# Patient Record
Sex: Female | Born: 1955 | Race: Black or African American | Hispanic: No | Marital: Single | Smoking: Former smoker
Health system: Southern US, Community
[De-identification: ages and names within clinical notes are randomized; demographics above are authoritative.]

## PROBLEM LIST (undated history)

## (undated) DIAGNOSIS — R7303 Prediabetes: Secondary | ICD-10-CM

## (undated) DIAGNOSIS — Z9889 Other specified postprocedural states: Secondary | ICD-10-CM

## (undated) DIAGNOSIS — E785 Hyperlipidemia, unspecified: Secondary | ICD-10-CM

## (undated) DIAGNOSIS — M1990 Unspecified osteoarthritis, unspecified site: Secondary | ICD-10-CM

## (undated) DIAGNOSIS — N8110 Cystocele, unspecified: Secondary | ICD-10-CM

## (undated) DIAGNOSIS — Z8739 Personal history of other diseases of the musculoskeletal system and connective tissue: Secondary | ICD-10-CM

## (undated) DIAGNOSIS — F419 Anxiety disorder, unspecified: Secondary | ICD-10-CM

## (undated) DIAGNOSIS — B029 Zoster without complications: Secondary | ICD-10-CM

## (undated) DIAGNOSIS — Z9289 Personal history of other medical treatment: Secondary | ICD-10-CM

## (undated) DIAGNOSIS — R112 Nausea with vomiting, unspecified: Secondary | ICD-10-CM

## (undated) DIAGNOSIS — T8859XA Other complications of anesthesia, initial encounter: Secondary | ICD-10-CM

## (undated) DIAGNOSIS — Z87448 Personal history of other diseases of urinary system: Secondary | ICD-10-CM

## (undated) DIAGNOSIS — T4145XA Adverse effect of unspecified anesthetic, initial encounter: Secondary | ICD-10-CM

## (undated) HISTORY — DX: Personal history of other diseases of the musculoskeletal system and connective tissue: Z87.39

## (undated) HISTORY — DX: Hyperlipidemia, unspecified: E78.5

## (undated) HISTORY — PX: OTHER SURGICAL HISTORY: SHX169

## (undated) HISTORY — DX: Cystocele, unspecified: N81.10

## (undated) HISTORY — PX: JOINT REPLACEMENT: SHX530

## (undated) HISTORY — DX: Zoster without complications: B02.9

## (undated) HISTORY — PX: ABDOMINAL HYSTERECTOMY: SHX81

## (undated) HISTORY — DX: Personal history of other medical treatment: Z92.89

## (undated) HISTORY — DX: Personal history of other diseases of urinary system: Z87.448

## (undated) HISTORY — PX: COLONOSCOPY: SHX174

---

## 2001-03-27 ENCOUNTER — Encounter: Payer: Self-pay | Admitting: Family Medicine

## 2001-03-27 ENCOUNTER — Ambulatory Visit (HOSPITAL_COMMUNITY): Admission: RE | Admit: 2001-03-27 | Discharge: 2001-03-27 | Payer: Self-pay | Admitting: Family Medicine

## 2001-05-27 DIAGNOSIS — Z9289 Personal history of other medical treatment: Secondary | ICD-10-CM

## 2001-05-27 HISTORY — DX: Personal history of other medical treatment: Z92.89

## 2001-08-19 ENCOUNTER — Ambulatory Visit (HOSPITAL_COMMUNITY): Admission: RE | Admit: 2001-08-19 | Discharge: 2001-08-19 | Payer: Self-pay | Admitting: Family Medicine

## 2001-08-19 ENCOUNTER — Encounter: Payer: Self-pay | Admitting: Family Medicine

## 2001-09-16 ENCOUNTER — Other Ambulatory Visit: Admission: RE | Admit: 2001-09-16 | Discharge: 2001-09-16 | Payer: Self-pay | Admitting: Obstetrics and Gynecology

## 2002-05-17 ENCOUNTER — Ambulatory Visit (HOSPITAL_COMMUNITY): Admission: RE | Admit: 2002-05-17 | Discharge: 2002-05-17 | Payer: Self-pay | Admitting: *Deleted

## 2002-05-17 ENCOUNTER — Encounter (INDEPENDENT_AMBULATORY_CARE_PROVIDER_SITE_OTHER): Payer: Self-pay | Admitting: *Deleted

## 2002-07-16 ENCOUNTER — Encounter: Admission: RE | Admit: 2002-07-16 | Discharge: 2002-07-16 | Payer: Self-pay | Admitting: Internal Medicine

## 2002-07-16 ENCOUNTER — Encounter: Payer: Self-pay | Admitting: Internal Medicine

## 2002-12-09 ENCOUNTER — Other Ambulatory Visit: Admission: RE | Admit: 2002-12-09 | Discharge: 2002-12-09 | Payer: Self-pay | Admitting: Obstetrics and Gynecology

## 2004-07-11 ENCOUNTER — Other Ambulatory Visit: Admission: RE | Admit: 2004-07-11 | Discharge: 2004-07-11 | Payer: Self-pay | Admitting: Obstetrics and Gynecology

## 2005-07-04 ENCOUNTER — Other Ambulatory Visit: Admission: RE | Admit: 2005-07-04 | Discharge: 2005-07-04 | Payer: Self-pay | Admitting: Obstetrics and Gynecology

## 2005-09-13 ENCOUNTER — Encounter: Admission: RE | Admit: 2005-09-13 | Discharge: 2005-09-13 | Payer: Self-pay | Admitting: Internal Medicine

## 2007-06-11 ENCOUNTER — Encounter: Admission: RE | Admit: 2007-06-11 | Discharge: 2007-06-11 | Payer: Self-pay | Admitting: Internal Medicine

## 2008-07-06 ENCOUNTER — Ambulatory Visit: Payer: Self-pay | Admitting: Vascular Surgery

## 2009-05-27 DIAGNOSIS — Z9289 Personal history of other medical treatment: Secondary | ICD-10-CM

## 2009-05-27 HISTORY — DX: Personal history of other medical treatment: Z92.89

## 2010-07-23 ENCOUNTER — Other Ambulatory Visit: Payer: Self-pay | Admitting: Internal Medicine

## 2010-07-23 DIAGNOSIS — Z1231 Encounter for screening mammogram for malignant neoplasm of breast: Secondary | ICD-10-CM

## 2010-08-02 ENCOUNTER — Ambulatory Visit
Admission: RE | Admit: 2010-08-02 | Discharge: 2010-08-02 | Disposition: A | Discharge status: Home / Self Care | Payer: BC Managed Care – PPO | Source: Ambulatory Visit | Attending: Internal Medicine | Admitting: Internal Medicine

## 2010-08-02 DIAGNOSIS — Z1231 Encounter for screening mammogram for malignant neoplasm of breast: Secondary | ICD-10-CM

## 2011-05-08 ENCOUNTER — Other Ambulatory Visit: Payer: Self-pay | Admitting: Sports Medicine

## 2011-05-08 DIAGNOSIS — M25552 Pain in left hip: Secondary | ICD-10-CM

## 2011-05-09 ENCOUNTER — Other Ambulatory Visit: Payer: BC Managed Care – PPO

## 2011-09-23 ENCOUNTER — Ambulatory Visit (INDEPENDENT_AMBULATORY_CARE_PROVIDER_SITE_OTHER): Payer: BC Managed Care – PPO | Admitting: Obstetrics and Gynecology

## 2011-09-23 ENCOUNTER — Encounter: Payer: Self-pay | Admitting: Obstetrics and Gynecology

## 2011-09-23 VITALS — BP 100/62 | HR 70 | Ht 68.0 in | Wt 159.0 lb

## 2011-09-23 DIAGNOSIS — B3731 Acute candidiasis of vulva and vagina: Secondary | ICD-10-CM

## 2011-09-23 DIAGNOSIS — B373 Candidiasis of vulva and vagina: Secondary | ICD-10-CM

## 2011-09-23 DIAGNOSIS — N898 Other specified noninflammatory disorders of vagina: Secondary | ICD-10-CM

## 2011-09-23 DIAGNOSIS — R32 Unspecified urinary incontinence: Secondary | ICD-10-CM

## 2011-09-23 DIAGNOSIS — R350 Frequency of micturition: Secondary | ICD-10-CM

## 2011-09-23 HISTORY — DX: Candidiasis of vulva and vagina: B37.3

## 2011-09-23 HISTORY — DX: Frequency of micturition: R35.0

## 2011-09-23 HISTORY — DX: Unspecified urinary incontinence: R32

## 2011-09-23 HISTORY — DX: Other specified noninflammatory disorders of vagina: N89.8

## 2011-09-23 HISTORY — DX: Acute candidiasis of vulva and vagina: B37.31

## 2011-09-23 LAB — POCT WET PREP (WET MOUNT)

## 2011-09-23 LAB — POCT URINALYSIS DIPSTICK
Glucose, UA: NEGATIVE
Ketones, UA: NEGATIVE
Spec Grav, UA: 1.02

## 2011-09-23 MED ORDER — ESTRADIOL 0.1 MG/GM VA CREA: 1.0000 g | TOPICAL_CREAM | Freq: Every day | VAGINAL | Status: DC

## 2011-09-23 MED ORDER — TERCONAZOLE 0.4 % VA CREA: 1.0000 | TOPICAL_CREAM | Freq: Every day | VAGINAL | Status: AC

## 2011-09-23 NOTE — Patient Instructions (Signed)
Atrophic Vaginitis  Atrophic vaginitis is a problem of low levels of estrogen in women. This problem can happen at any age. It is most common in women who have gone through menopause ("the change").    HOW WILL I KNOW IF I HAVE THIS PROBLEM?  You may have:   Trouble with peeing (urinating), such as:   Going to the bathroom often.   A hard time holding your pee until you reach a bathroom.   Leaking pee.   Having pain when you pee.   Itching or a burning feeling.   Vaginal bleeding and spotting.   Pain during sex.   Dryness of the vagina.   A yellow, bad-smelling fluid (discharge) coming from the vagina.  HOW WILL MY DOCTOR CHECK FOR THIS PROBLEM?   During your exam, your doctor will likely find the problem.   If there is a vaginal fluid, it may be checked for infection.  HOW WILL THIS PROBLEM BE TREATED?  Keep the vulvar skin as clean as possible. Moisturizers and lubricants can help with some of the symptoms.  Estrogen replacement can help. There are 2 ways to take estrogen:   Systemic estrogen gets estrogen to your whole body. It takes many weeks or months before the symptoms get better.   You take an estrogen pill.   You use a skin patch. This is a patch that you put on your skin.   If you still have your uterus, your doctor may ask you to take a hormone. Talk to your doctor about the right medicine for you.   Estrogen cream.  This puts estrogen only at the part of your body where you apply it. The cream is put into the vagina or put on the vulvar skin. For some women, estrogen cream works faster than pills or the patch.  CAN ALL WOMEN WITH THIS PROBLEM USE ESTROGEN?  No. Women with certain types of cancer, liver problems, or problems with blood clots should not take estrogen. Your doctor can help you decide the best treatment for your symptoms.  Document Released: 10/30/2007 Document Revised: 05/02/2011 Document Reviewed: 10/30/2007  [employer] Patient Information 2012 [employer].

## 2011-09-23 NOTE — Progress Notes (Signed)
Last Pap: 09/01/2007 WNL: Yes Regular Periods:no-post menopausal   Monthly Breast exam:no Tetanus<58yrs:yes Nl.Bladder Function:yes Daily BMs:yes Healthy Diet:yes Calcium:yes Mammogram:yes 08/02/2010 Exercise:yes Seatbelt: yes Abuse at home: no Stressful work:no Sigmoid-colonoscopy: yes, pt unsure of date Katelyn Copeland  Subjective:    Katelyn Copeland is a 56 y.o. female No obstetric history on file. who presents for annual exam.  The patient c/o deep dysparuenia and post coital urinary incontinence on one occasion.  The following portions of the patient's history were reviewed and updated as appropriate: allergies, current medications, past family history, past medical history, past social history, past surgical history and problem list.  Review of Systems Pertinent items are noted in HPI. Gastrointestinal:No change in bowel habits, no abdominal pain, no rectal bleeding Genitourinary:negative for dysuria, frequency, hematuria, nocturia and urinary incontinence    Objective:     BP 100/62  Pulse 70  Ht 5\' 8"  (1.727 m)  Wt 159 lb (72.122 kg)  BMI 24.18 kg/m2  Weight:  Wt Readings from Last 1 Encounters:  09/23/11 159 lb (72.122 kg)     BMI: Body mass index is 24.18 kg/(m^2). General Appearance: Alert, appropriate appearance for age. No acute distress HEENT: Grossly normal Neck / Thyroid: Supple, no masses, nodes or enlargement Lungs: clear to auscultation bilaterally Back: No CVA tenderness Breast Exam: No masses or nodes.No dimpling, nipple retraction or discharge. Cardiovascular: Regular rate and rhythm. S1, S2, no murmur Gastrointestinal: Soft, non-tender, no masses or organomegaly Pelvic Exam: External genitalia: normal general appearance Vaginal: cystocele present, within 5 cm o introitus Cervix: absent Adnexa: non palpable Uterus: absent Posterior vaginal excoriation with bleeding Rectovaginal: normal rectal, no masses Lymphatic Exam: Non-palpable nodes in  neck, clavicular, axillary, or inguinal regions Skin: no rash or abnormalities Neurologic: Normal gait and speech, no tremor  Psychiatric: Alert and oriented, appropriate affect.    Urinalysis:2+ lekocytes Wet Prep:  Monilia      Assessment:    pelvic relaxation  R/O UTI  Monilia vaginitis Atrophic vaginitis  Plan:    All questions answered. KOH prep.  Terazol Estradiol vaginal cream Follow-up:  in 6 week(s)

## 2011-11-04 ENCOUNTER — Encounter: Payer: BC Managed Care – PPO | Admitting: Obstetrics and Gynecology

## 2011-12-05 ENCOUNTER — Other Ambulatory Visit: Payer: Self-pay | Admitting: Orthopedic Surgery

## 2011-12-05 DIAGNOSIS — M25552 Pain in left hip: Secondary | ICD-10-CM

## 2011-12-05 DIAGNOSIS — M2390 Unspecified internal derangement of unspecified knee: Secondary | ICD-10-CM

## 2011-12-09 ENCOUNTER — Ambulatory Visit
Admission: RE | Admit: 2011-12-09 | Discharge: 2011-12-09 | Disposition: A | Discharge status: Home / Self Care | Payer: BC Managed Care – PPO | Source: Ambulatory Visit | Attending: Orthopedic Surgery | Admitting: Orthopedic Surgery

## 2011-12-09 DIAGNOSIS — M2390 Unspecified internal derangement of unspecified knee: Secondary | ICD-10-CM

## 2011-12-09 DIAGNOSIS — M25552 Pain in left hip: Secondary | ICD-10-CM

## 2011-12-17 ENCOUNTER — Other Ambulatory Visit: Payer: Self-pay | Admitting: Obstetrics and Gynecology

## 2011-12-18 NOTE — Telephone Encounter (Signed)
vph pt 

## 2012-01-07 ENCOUNTER — Telehealth: Payer: Self-pay | Admitting: Obstetrics and Gynecology

## 2012-01-08 ENCOUNTER — Encounter: Payer: Self-pay | Admitting: Obstetrics and Gynecology

## 2012-01-08 ENCOUNTER — Ambulatory Visit (INDEPENDENT_AMBULATORY_CARE_PROVIDER_SITE_OTHER): Payer: BC Managed Care – PPO | Admitting: Obstetrics and Gynecology

## 2012-01-08 VITALS — BP 102/60 | Temp 98.1°F | Ht 68.0 in | Wt 154.0 lb

## 2012-01-08 DIAGNOSIS — Z9189 Other specified personal risk factors, not elsewhere classified: Secondary | ICD-10-CM

## 2012-01-08 DIAGNOSIS — A599 Trichomoniasis, unspecified: Secondary | ICD-10-CM

## 2012-01-08 DIAGNOSIS — Z202 Contact with and (suspected) exposure to infections with a predominantly sexual mode of transmission: Secondary | ICD-10-CM

## 2012-01-08 DIAGNOSIS — N898 Other specified noninflammatory disorders of vagina: Secondary | ICD-10-CM

## 2012-01-08 LAB — POCT WET PREP (WET MOUNT)
Clue Cells Wet Prep Whiff POC: NEGATIVE
KOH Wet Prep POC: NEGATIVE

## 2012-01-08 MED ORDER — METRONIDAZOLE 500 MG PO TABS: ORAL_TABLET | ORAL | Status: DC

## 2012-01-08 NOTE — Progress Notes (Signed)
Problem visit  Vaginal discharge:Color: yellow Odor: yes Itching:no Thin:no Thick:yes Fever:no Dyspareunia:no Hx PID:no HX STD:no Pelvic Pain:no Desires Gc/CT:yes Desires HIV,RPR,HbsAG:yes  Subjective: The patient gives a history of a new sexual partner that she later found out was not monogamous. She complains of new onset of vaginal discharge with odor. She wants testing for STDs.  Objective: BP 102/60  Temp 98.1 F (36.7 C) (Oral)  Ht 5\' 8"  (1.727 m)  Wt 154 lb (69.854 kg)  BMI 23.42 kg/m2  Pelvic: EGBUS within normal limits   Vagina rugous with a well healed vault. There is grayish discharge in the vault   Uterus and cervix are surgically absent   Bimanual no masses Wet prep: Trichomonas  Assessment: Trichomonas Rule out other STDs  Recommendation: Flagyl 2 g p.o. Stat Diflucan 150 mg p.o. After completion of Flagyl Serology for HIV RPR herpes type I and 2 and hepatitis B and C. GC and Chlamydia on urine Patient will be notified of the results Followup at annual examination

## 2012-01-09 LAB — GC/CHLAMYDIA PROBE AMP, URINE: Chlamydia, Swab/Urine, PCR: NEGATIVE

## 2012-01-14 ENCOUNTER — Telehealth: Payer: Self-pay

## 2012-01-14 ENCOUNTER — Other Ambulatory Visit: Payer: Self-pay

## 2012-01-14 MED ORDER — FLUCONAZOLE 150 MG PO TABS: 150.0000 mg | ORAL_TABLET | Freq: Once | ORAL | Status: AC

## 2012-01-14 NOTE — Telephone Encounter (Signed)
Tc to pt per test results. 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 All other std testings=negative. Pt voices understanding and copy of labs mailed to pt per pt's request. Pt states,"vph was suppose to call in Diflucan to take after completing ATB's". Pt with h/o yeast infections s/p ATB usage. Pt c/o vaginal itching. Will consult with vph per recs and cb. Pt agrees.

## 2012-03-31 ENCOUNTER — Encounter: Payer: Self-pay | Admitting: Obstetrics and Gynecology

## 2012-03-31 ENCOUNTER — Ambulatory Visit (INDEPENDENT_AMBULATORY_CARE_PROVIDER_SITE_OTHER): Payer: BC Managed Care – PPO | Admitting: Obstetrics and Gynecology

## 2012-03-31 VITALS — BP 92/60 | Temp 98.8°F | Wt 156.0 lb

## 2012-03-31 DIAGNOSIS — B373 Candidiasis of vulva and vagina: Secondary | ICD-10-CM

## 2012-03-31 DIAGNOSIS — N898 Other specified noninflammatory disorders of vagina: Secondary | ICD-10-CM

## 2012-03-31 DIAGNOSIS — Z113 Encounter for screening for infections with a predominantly sexual mode of transmission: Secondary | ICD-10-CM

## 2012-03-31 DIAGNOSIS — R350 Frequency of micturition: Secondary | ICD-10-CM

## 2012-03-31 DIAGNOSIS — N390 Urinary tract infection, site not specified: Secondary | ICD-10-CM

## 2012-03-31 LAB — POCT URINALYSIS DIPSTICK
Ketones, UA: NEGATIVE
Protein, UA: NEGATIVE
Spec Grav, UA: 1.015
Urobilinogen, UA: NEGATIVE
pH, UA: 5

## 2012-03-31 MED ORDER — CIPROFLOXACIN HCL 500 MG PO TABS: 500.0000 mg | ORAL_TABLET | Freq: Two times a day (BID) | ORAL | Status: AC

## 2012-03-31 MED ORDER — TERCONAZOLE 0.4 % VA CREA: 1.0000 | TOPICAL_CREAM | Freq: Every day | VAGINAL | Status: DC

## 2012-03-31 NOTE — Progress Notes (Signed)
56 YO complains of an itchy vaginal discharge for several weeks that dries itchy and crusty on panties.  Denies urinary tract symptoms but would like for urine to be checked.  O: Pelvic: EGBUS-wnl, vagina-scant white discharge, uterus/cervix-surgically absent  Wet Prep:  pH-4.5,  whiff-negative, many-yeast OSOM Trich-negative U/A- pH-5.0,  SG-1.015, 1+ leuk,  nitrite-positive, 2+-blood otherwise negative  A: UTI     Yeast Vaginitis   P: Urine for culture       Terazol 7 Vaginal Cream #1 tube 1 applicator pv qhs x 7 days no refills      Cipro 500 mg # 14 bid x 7 days no refills      RTO-as scheduled  Keighan Amezcua, PA-C

## 2012-03-31 NOTE — Progress Notes (Signed)
Vag. Discharge:yes Odor:no Fever:no Irreg.Periods:no Dyspareunia:no Dysuria:no Frequency:yes Urgency:no Hematuria:no Kidney stones:no Constipation:no Diarrhea:no Rectal Bleeding: no Vomiting:no Nausea:no Pregnant:no Fibroids:no Endometriosis:no Hx of Ovarian Cyst:no Hx IUD:no Hx STD-PID:yes trich Appendectomy:no Gall Bladder Dz:no

## 2012-03-31 NOTE — Patient Instructions (Signed)
Urinary Tract Infection A urinary tract infection (UTI) is often caused by a germ (bacteria). A UTI is usually helped with medicine (antibiotics) that kills germs. Take all the medicine until it is gone. Do this even if you are feeling better. You are usually better in 7 to 10 days. HOME CARE   Drink enough water and fluids to keep your pee (urine) clear or pale yellow. Drink:  Cranberry juice.  Water.  Avoid:  Caffeine.  Tea.  Bubbly (carbonated) drinks.  Alcohol.  Only take medicine as told by your doctor.  To prevent further infections:  Pee often.  After pooping (bowel movement), women should wipe from front to back. Use each tissue only once.  Pee before and after having sex (intercourse). Ask your doctor when your test results will be ready. Make sure you follow up and get your test results.  GET HELP RIGHT AWAY IF:   There is very bad back pain or lower belly (abdominal) pain.  You get the chills.  You have a fever.  Your baby is older than 3 months with a rectal temperature of 102 F (38.9 C) or higher.  Your baby is 82 months old or younger with a rectal temperature of 100.4 F (38 C) or higher.  You feel sick to your stomach (nauseous) or throw up (vomit).  There is continued burning with peeing.  Your problems are not better in 3 days. Return sooner if you are getting worse. MAKE SURE YOU:   Understand these instructions.  Will watch your condition.  Will get help right away if you are not doing well or get worse. Document Released: 10/30/2007 Document Revised: 08/05/2011 Document Reviewed: 10/30/2007 [facility] Patient Information 2013 [place], [place].  Monilial Vaginitis Vaginitis in a soreness, swelling and redness (inflammation) of the vagina and vulva. Monilial vaginitis is not a sexually transmitted infection. CAUSES  Yeast vaginitis is caused by yeast (candida) that is normally found in your vagina. With a yeast infection, the candida has  overgrown in number to a point that upsets the chemical balance. SYMPTOMS   White, thick vaginal discharge.  Swelling, itching, redness and irritation of the vagina and possibly the lips of the vagina (vulva).  Burning or painful urination.  Painful intercourse. DIAGNOSIS  Things that may contribute to monilial vaginitis are:  Postmenopausal and virginal states.  Pregnancy.  Infections.  Being tired, sick or stressed, especially if you had monilial vaginitis in the past.  Diabetes. Good control will help lower the chance.  Birth control pills.  Tight fitting garments.  Using bubble bath, feminine sprays, douches or deodorant tampons.  Taking certain medications that kill germs (antibiotics).  Sporadic recurrence can occur if you become ill. TREATMENT  Your caregiver will give you medication.  There are several kinds of anti monilial vaginal creams and suppositories specific for monilial vaginitis. For recurrent yeast infections, use a suppository or cream in the vagina 2 times a week, or as directed.  Anti-monilial or steroid cream for the itching or irritation of the vulva may also be used. Get your caregiver's permission.  Painting the vagina with methylene blue solution may help if the monilial cream does not work.  Eating yogurt may help prevent monilial vaginitis. HOME CARE INSTRUCTIONS   Finish all medication as prescribed.  Do not have sex until treatment is completed or after your caregiver tells you it is okay.  Take warm sitz baths.  Do not douche.  Do not use tampons, especially scented ones.  Wear  cotton underwear.  Avoid tight pants and panty hose.  Tell your sexual partner that you have a yeast infection. They should go to their caregiver if they have symptoms such as mild rash or itching.  Your sexual partner should be treated as well if your infection is difficult to eliminate.  Practice safer sex. Use condoms.  Some vaginal medications  cause latex condoms to fail. Vaginal medications that harm condoms are:  Cleocin cream.  Butoconazole (Femstat).  Terconazole (Terazol) vaginal suppository.  Miconazole (Monistat) (may be purchased over the counter). SEEK MEDICAL CARE IF:   You have a temperature by mouth above 102 F (38.9 C).  The infection is getting worse after 2 days of treatment.  The infection is not getting better after 3 days of treatment.  You develop blisters in or around your vagina.  You develop vaginal bleeding, and it is not your menstrual period.  You have pain when you urinate.  You develop intestinal problems.  You have pain with sexual intercourse. Document Released: 02/20/2005 Document Revised: 08/05/2011 Document Reviewed: 11/04/2008 [facility] Patient Information 2013 [place], [place].  Avoid: - excess soap on genital area (consider using plain oatmeal soap) - use of powder or sprays in genital area - douching - wearing underwear to bed (except with menses) - using more than is directed detergent when washing clothes - tight fitting garments around genital area - excess sugar intake

## 2012-04-02 LAB — URINE CULTURE

## 2014-02-10 ENCOUNTER — Other Ambulatory Visit: Payer: Self-pay

## 2014-02-10 DIAGNOSIS — Z1231 Encounter for screening mammogram for malignant neoplasm of breast: Secondary | ICD-10-CM

## 2014-03-02 ENCOUNTER — Ambulatory Visit
Admission: RE | Admit: 2014-03-02 | Discharge: 2014-03-02 | Disposition: A | Discharge status: Home / Self Care | Payer: BC Managed Care – PPO | Source: Ambulatory Visit

## 2014-03-02 DIAGNOSIS — Z1231 Encounter for screening mammogram for malignant neoplasm of breast: Secondary | ICD-10-CM

## 2014-03-04 ENCOUNTER — Other Ambulatory Visit: Payer: Self-pay | Admitting: Family Medicine

## 2014-03-04 DIAGNOSIS — R928 Other abnormal and inconclusive findings on diagnostic imaging of breast: Secondary | ICD-10-CM

## 2014-03-15 ENCOUNTER — Ambulatory Visit
Admission: RE | Admit: 2014-03-15 | Discharge: 2014-03-15 | Disposition: A | Discharge status: Home / Self Care | Payer: BC Managed Care – PPO | Source: Ambulatory Visit | Attending: Family Medicine | Admitting: Family Medicine

## 2014-03-15 DIAGNOSIS — R928 Other abnormal and inconclusive findings on diagnostic imaging of breast: Secondary | ICD-10-CM

## 2014-03-28 ENCOUNTER — Encounter: Payer: Self-pay | Admitting: Obstetrics and Gynecology

## 2015-01-03 ENCOUNTER — Encounter: Payer: Self-pay | Admitting: *Deleted

## 2015-02-20 ENCOUNTER — Encounter: Payer: Self-pay | Admitting: Cardiovascular Disease

## 2015-02-27 NOTE — Progress Notes (Signed)
Cardiology Office Note   Date:  02/28/2015   ID:  Wilhelmena, Katelyn Copeland 11, 1957, MRN [id number]  PCP:  Cala Bradford, MD  Cardiologist:   Madilyn Hook, MD   Chief Complaint  Patient presents with  . New Evaluation    pt c/o no chest pain, no SOB, no swelling in feet, and hasn't been light headed or dizzy      History of Present Illness: Katelyn Copeland is a 59 y.o. female who presents to establish care.  Both her parents had heart disease and she previously was a patient of Dr. Alanda Amass.  She reports that she has been well and denies chest pain, shortness of breath, lightheadedness, dizziness, palpitations, nausea, vomiting, diaphoresis, lower extremity edema, orthopnea, or PND. She is nervous about her cardiovascular risk because her boss recently had open heart surgery.  She stated that he had no symptoms at this time but ultimately did a quadruple bypass. She requests stress testing. [name] does not get any formal exercise but is active at work. She has a treadmill that she has used 2 or 3 times in the last 8 months. She denies any exertional chest pain or shortness of breath when she does exercise.  [name] mom had a pacemaker and her father had heart failure. She quit smoking 30 years ago and reports having a healthy diet. She takes mostly water and does not drink soft drinks. She does not have any problems with portion control and does not eat any fried foods.   Past Medical History  Diagnosis Date  . H/O osteopenia   . Vaginal prolapse     h/o  . H/O: hematuria   . Shingles   . H/O echocardiogram 2011    normal study  . H/O cardiovascular stress test 2011    normal myo stsiudy  . H/O Doppler ultrasound 2003    normal study    Past Surgical History  Procedure Laterality Date  . Abdominal hysterectomy    . Colonoscopy       Current Outpatient Prescriptions  Medication Sig Dispense Refill  . Biotin 1000 MCG tablet Take 1,000 mcg by mouth  daily.    . Calcium Carbonate-Vitamin D (CALCIUM + D PO) Take by mouth.    . cetirizine (ZYRTEC) 10 MG tablet Take 10 mg by mouth daily.    . fish oil-omega-3 fatty acids 1000 MG capsule Take 2 g by mouth daily.    . fluticasone (FLONASE) 50 MCG/ACT nasal spray Place 2 sprays into both nostrils daily.  12  . LORazepam (ATIVAN) 1 MG tablet Take 1 mg by mouth 2 (two) times a week.    . vitamin E 400 UNIT capsule Take 400 Units by mouth daily.     No current facility-administered medications for this visit.    Allergies:   Erythromycin    Social History:  The patient  reports that she has quit smoking. She has never used smokeless tobacco. She reports that she does not drink alcohol or use illicit drugs.   Family History:  The patient's family history includes Heart disease in her father, maternal grandfather, mother, and paternal grandmother.    ROS:  Please see the history of present illness.   Otherwise, review of systems are positive for none.   All other systems are reviewed and negative.    PHYSICAL EXAM: VS:  BP 106/82 mmHg  Pulse 70  Ht 5' 7.5" (1.715 m)  Wt 71.305 kg (157 lb  3.2 oz)  BMI 24.24 kg/m2 , BMI Body mass index is 24.24 kg/(m^2). GENERAL:  Well appearing HEENT:  Pupils equal round and reactive, fundi not visualized, oral mucosa unremarkable NECK:  No jugular venous distention, waveform within normal limits, carotid upstroke brisk and symmetric, no bruits, no thyromegaly LYMPHATICS:  No cervical adenopathy LUNGS:  Clear to auscultation bilaterally HEART:  RRR.  PMI not displaced or sustained,S1 and S2 within normal limits, no S3, no S4, no clicks, no rubs, no murmurs ABD:  Flat, positive bowel sounds normal in frequency in pitch, no bruits, no rebound, no guarding, no midline pulsatile mass, no hepatomegaly, no splenomegaly EXT:  2 plus pulses throughout, no edema, no cyanosis no clubbing SKIN:  No rashes no nodules NEURO:  Cranial nerves II through XII grossly  intact, motor grossly intact throughout PSYCH:  Cognitively intact, oriented to person place and time    EKG:  EKG is ordered today. The ekg ordered today demonstrates sinus rhythm at 70 bpm.  L axis deviation.     Recent Labs: No results found for requested labs within last 365 days.    Lipid Panel No results found for: CHOL, TRIG, HDL, CHOLHDL, VLDL, LDLCALC, LDLDIRECT    Wt Readings from Last 3 Encounters:  02/28/15 71.305 kg (157 lb 3.2 oz)  03/31/12 70.761 kg (156 lb)  01/08/12 69.854 kg (154 lb)      Other studies Reviewed: Additional studies/ records that were reviewed today include: . Review of the above records demonstrates:  Please see elsewhere in the note.     ASSESSMENT AND PLAN:  # CV Risk Assessment: Ms. Clos does not have any symptoms of cardiovascular disease. She has no chest discomfort fatigue, or shortness of breath with exertion. Therefore we will not refer her for stress testing at this time. We did however recommend that she increase her physical activities to 30-40 minutes of aerobic activity most days of the week. We will also check her fasting lipid panel today in order to assess her 10 year cardiovascular risk. We discussed the fact that this will allow [place] to determine whether she should be on an aspirin or medication for her lipids.    Current medicines are reviewed at length with the patient today.  The patient does not have concerns regarding medicines.   The following changes have been made:  no change  Labs/ tests ordered today include:  No orders of the defined types were placed in this encounter.     Disposition:   FU with Eleana Tocco C. Duke Salvia, MD in 1 year.    Signed, Madilyn Hook, MD  02/28/2015 8:07 AM    [facility]

## 2015-02-28 ENCOUNTER — Encounter: Payer: Self-pay | Admitting: Cardiovascular Disease

## 2015-02-28 ENCOUNTER — Ambulatory Visit (INDEPENDENT_AMBULATORY_CARE_PROVIDER_SITE_OTHER): Payer: BC Managed Care – PPO | Admitting: Cardiovascular Disease

## 2015-02-28 VITALS — BP 106/82 | HR 70 | Ht 67.5 in | Wt 157.2 lb

## 2015-02-28 DIAGNOSIS — E785 Hyperlipidemia, unspecified: Secondary | ICD-10-CM | POA: Diagnosis not present

## 2015-02-28 DIAGNOSIS — Z7189 Other specified counseling: Secondary | ICD-10-CM | POA: Diagnosis not present

## 2015-02-28 LAB — LIPID PANEL
Cholesterol: 195 mg/dL (ref 125–200)
HDL: 52 mg/dL (ref >=46)
LDL CALC: 121 mg/dL (ref <130)
TRIGLYCERIDES: 108 mg/dL (ref <150)
Total CHOL/HDL Ratio: 3.8 Ratio (ref <=5.0)
VLDL: 22 mg/dL (ref <30)

## 2015-02-28 NOTE — Patient Instructions (Signed)
Your physician recommends that you return for lab work at your earliest convenience - FASTING.  Dr [place] recommends that you schedule a follow-up appointment in 1 year. You will receive a reminder letter in the mail two months in advance. If you don't receive a letter, please call our office to schedule the follow-up appointment. 

## 2015-03-06 ENCOUNTER — Telehealth: Payer: Self-pay | Admitting: *Deleted

## 2015-03-06 NOTE — Telephone Encounter (Signed)
-----   Message from Chilton Si, MD sent at 03/05/2015 11:15 AM EDT ----- Cholesterol levels are good.  No changes recommended.

## 2015-03-06 NOTE — Telephone Encounter (Signed)
Spoke to patient. Result given . Verbalized understanding  

## 2016-03-27 ENCOUNTER — Other Ambulatory Visit: Payer: Self-pay | Admitting: Sports Medicine

## 2016-03-27 DIAGNOSIS — M25521 Pain in right elbow: Secondary | ICD-10-CM

## 2016-04-01 ENCOUNTER — Other Ambulatory Visit: Payer: BC Managed Care – PPO

## 2017-11-20 ENCOUNTER — Encounter (INDEPENDENT_AMBULATORY_CARE_PROVIDER_SITE_OTHER): Payer: Self-pay | Admitting: Orthopaedic Surgery

## 2017-11-20 ENCOUNTER — Ambulatory Visit (INDEPENDENT_AMBULATORY_CARE_PROVIDER_SITE_OTHER): Payer: BC Managed Care – PPO | Admitting: Orthopaedic Surgery

## 2017-11-20 ENCOUNTER — Ambulatory Visit (INDEPENDENT_AMBULATORY_CARE_PROVIDER_SITE_OTHER): Payer: Self-pay

## 2017-11-20 DIAGNOSIS — M25552 Pain in left hip: Secondary | ICD-10-CM | POA: Diagnosis not present

## 2017-11-20 NOTE — Progress Notes (Signed)
Office Visit Note   Patient: Katelyn Copeland           Date of Birth: 12-Sep-1955           MRN: [id number] Visit Date: 11/20/2017              Requested by: Laurann MontanaWhite, Cerita, MD [phone] [place], [place] [address] PCP: Laurann MontanaWhite, Hayleen, MD   Assessment & Plan: Visit Diagnoses:  1. 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  I spent a consider amount of time showing her her x-rays and going over hip model explaining detail what the surgery involves.  We talked about the risk and benefits of the surgery.  We talked about her intraoperative and postoperative course and what this involves.  All questions concerns were answered and addressed.  She is interested in setting the surgery up around September or October of this year.  I gave her handout about anterior hip replacement surgery as well.  We will give her a call to schedule the surgery.  Follow-Up Instructions: Return for 2 weeks post-op.   Orders:  Orders Placed This Encounter  Procedures  . XR HIP UNILAT W OR W/O PELVIS 1V LEFT   No orders of the defined types were placed in this encounter.     Procedures: No procedures performed   Clinical Data: No additional findings.   Subjective: Chief Complaint  Patient presents with  . Left Hip - Pain  The patient is a very pleasant 62 year old active female who comes for evaluation treatment of known osteoarthritis and general joint disease of her left hip.  She has been dealing with hip pain for about 2 years now.  She has had at least a few intra-articular injections in this left hip of a steroid under direct fluoroscopy.  She has significant amount of groin pain.  The injections used to work but now they do not work  anymore at all.  She walks with a significant limp and alerts to her gait.  She says that the pain is gotten to where it is detrimentally affected her activities of daily living, her quality of life, and her mobility.  At this point she is hoping to proceed with total hip arthroplasty in the fall of this year.  She still takes anti-inflammatories and still work on activity modification but she is not interested in any more injections as now they do not work for her at all.  She is otherwise very healthy and active individual.  She does live alone.  She does have a son that can come and help her out short-term at the time of surgery.  She currently denies any headache, chest pain, shortness of breath, fever, chills, nausea, vomiting.  HPI  Review of Systems She is denies any systemic illnesses as a relates to her chief complaint of left hip pain.  Objective: Vital Signs: There were no vitals taken for this visit.  Physical Exam She is alert and oriented x3 and in no acute distress Ortho Exam She does walk with a slight limp.  She has severe pain with attempts of internal or external rotation of her left hip and she lacks full rotation  as well.  Her right hip is normal on exam.  Her knees and foot and ankle are normal on exam.   Specialty Comments:  No specialty comments available.  Imaging: Xr Hip Unilat W Or W/o Pelvis 1v Left  Result Date: 11/20/2017 An AP pelvis and a lateral of the left hip show severe arthritic changes of the left hip.  There is joint space narrowing and large para-articular osteophytes around the femoral head and acetabulum.  There is sclerotic changes as well.    PMFS History: Patient Active Problem List   Diagnosis Date Noted  . Urinary frequency 09/23/2011  . Vaginal discharge 09/23/2011  . Incontinent of urine 09/23/2011  . 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 H/O cardiovascular stress test 2011   normal myo stsiudy  .  H/O Doppler ultrasound 2003   normal study  . H/O echocardiogram 2011   normal study  . H/O osteopenia   . H/O: hematuria   . Hyperlipidemia   . Shingles   . Vaginal prolapse    h/o    Family History  Problem Relation Age of Onset  . Heart disease Paternal Grandmother   . Heart disease Maternal Grandfather   . Heart disease Father   . Heart disease Mother     Past Surgical History:  Procedure Laterality Date  . ABDOMINAL HYSTERECTOMY    . COLONOSCOPY     Social History   Occupational History  . Not on file  Tobacco Use  . Smoking status: Former Games developer  . Smokeless tobacco: Never Used  Substance and Sexual Activity  . Alcohol use: No  . Drug use: No  . Sexual activity: Yes    Partners: Male    Birth control/protection: Other-see comments, Post-menopausal    Comment: pt has had hysterectomy

## 2018-04-20 ENCOUNTER — Encounter (HOSPITAL_COMMUNITY): Payer: Self-pay | Admitting: *Deleted

## 2018-04-21 ENCOUNTER — Other Ambulatory Visit (INDEPENDENT_AMBULATORY_CARE_PROVIDER_SITE_OTHER): Payer: Self-pay | Admitting: Physician Assistant

## 2018-04-27 ENCOUNTER — Other Ambulatory Visit (INDEPENDENT_AMBULATORY_CARE_PROVIDER_SITE_OTHER): Payer: Self-pay

## 2018-04-29 ENCOUNTER — Telehealth (INDEPENDENT_AMBULATORY_CARE_PROVIDER_SITE_OTHER): Payer: Self-pay | Admitting: Orthopaedic Surgery

## 2018-04-29 NOTE — Telephone Encounter (Signed)
Ok for what ever desk she needs

## 2018-04-29 NOTE — Telephone Encounter (Signed)
Patient states her employer is requesting  An updated letter that states she needs a electronic stand up desk. She was given a stand up desk but it was manual not electric.

## 2018-04-29 NOTE — Patient Instructions (Addendum)
Katelyn Copeland  04/29/2018   Your procedure is scheduled on: 05-08-18   [facility]             Report to admitting at      0830 AM    Call this number if you have problems the morning of surgery [phone]    Remember: Do not eat food or drink liquids :After Midnight.   BRUSH YOUR TEETH MORNING OF SURGERY AND RINSE YOUR MOUTH OUT, NO CHEWING GUM CANDY OR MINTS.     Take these medicines the morning of surgery with A SIP OF WATER: zyrtec                                 You may not have any metal on your body including hair pins and              piercings  Do not wear jewelry, make-up, lotions, powders or perfumes, deodorant             Do not wear nail polish.  Do not shave  48 hours prior to surgery.               Do not bring valuables to the hospital. [facility] IS NOT             RESPONSIBLE   FOR VALUABLES.  Contacts, dentures or bridgework may not be worn into surgery.  Leave suitcase in the car. After surgery it may be brought to your room.                   Please read over the following fact sheets you were given: _____________________________________________________________________             [facility] - Preparing for [facility] Before surgery, you can play an important role.  Because skin is not sterile, your skin needs to be as free of germs as possible.  You can reduce the number of germs on your skin by washing with CHG (chlorahexidine gluconate) soap before surgery.  CHG is an antiseptic cleaner which kills germs and bonds with the skin to continue killing germs even after washing. Please DO NOT use if you have an allergy to CHG or antibacterial soaps.  If your skin becomes reddened/irritated stop using the CHG and inform your nurse when you arrive at Short Stay. Do not shave (including legs and underarms) for at least 48 hours prior to the first CHG shower.  You may shave your face/neck. Please follow  these instructions carefully:  1.  Shower with CHG Soap the night before surgery and the  morning of Surgery.  2.  If you choose to wash your hair, wash your hair first as usual with your  normal  shampoo.  3.  After you shampoo, rinse your hair and body thoroughly to remove the  shampoo.                           4.  Use CHG as you would any other liquid soap.  You can apply chg directly  to the skin and wash                       Gently with a scrungie or clean washcloth.  5.  Apply the CHG Soap to your body ONLY FROM THE NECK DOWN.   Do not use on face/ open                           Wound or open sores. Avoid contact with eyes, ears mouth and genitals (private parts).                       Wash face,  Genitals (private parts) with your normal soap.             6.  Wash thoroughly, paying special attention to the area where your surgery  will be performed.  7.  Thoroughly rinse your body with warm water from the neck down.  8.  DO NOT shower/wash with your normal soap after using and rinsing off  the CHG Soap.                9.  Pat yourself dry with a clean towel.            10.  Wear clean pajamas.            11.  Place clean sheets on your bed the night of your first shower and do not  sleep with pets. Day of Surgery : Do not apply any lotions/deodorants the morning of surgery.  Please wear clean clothes to the hospital/surgery center.  FAILURE TO FOLLOW THESE INSTRUCTIONS MAY RESULT IN THE CANCELLATION OF YOUR SURGERY PATIENT SIGNATURE_________________________________  NURSE SIGNATURE__________________________________  ________________________________________________________________________   Katelyn Copeland  An incentive spirometer is a tool that can help keep your lungs clear and active. This tool measures how well you are filling your lungs with each breath. Taking long deep breaths may help reverse or decrease the chance of developing breathing (pulmonary) problems  (especially infection) following:  A long period of time when you are unable to move or be active. BEFORE THE PROCEDURE   If the spirometer includes an indicator to show your best effort, your nurse or respiratory therapist will set it to a desired goal.  If possible, sit up straight or lean slightly forward. Try not to slouch.  Hold the incentive spirometer in an upright position. INSTRUCTIONS FOR USE  1. Sit on the edge of your bed if possible, or sit up as far as you can in bed or on a chair. 2. Hold the incentive spirometer in an upright position. 3. Breathe out normally. 4. Place the mouthpiece in your mouth and seal your lips tightly around it. 5. Breathe in slowly and as deeply as possible, raising the piston or the ball toward the top of the column. 6. Hold your breath for 3-5 seconds or for as long as possible. Allow the piston or ball to fall to the bottom of the column. 7. Remove the mouthpiece from your mouth and breathe out normally. 8. Rest for a few seconds and repeat Steps 1 through 7 at least 10 times every 1-2 hours when you are awake. Take your time and take a few normal breaths between deep breaths. 9. The spirometer may include an indicator to show your best effort. Use the indicator as a goal to work toward during each repetition. 10. After each set of 10 deep breaths, practice coughing to be sure your lungs are clear. If you have an incision (the cut made at the time of surgery), support your incision when coughing by placing a  pillow or rolled up towels firmly against it. Once you are able to get out of bed, walk around indoors and cough well. You may stop using the incentive spirometer when instructed by your caregiver.  RISKS AND COMPLICATIONS  Take your time so you do not get dizzy or light-headed.  If you are in pain, you may need to take or ask for pain medication before doing incentive spirometry. It is harder to take a deep breath if you are having  pain. AFTER USE  Rest and breathe slowly and easily.  It can be helpful to keep track of a log of your progress. Your caregiver can provide you with a simple table to help with this. If you are using the spirometer at home, follow these instructions: SEEK MEDICAL CARE IF:   You are having difficultly using the spirometer.  You have trouble using the spirometer as often as instructed.  Your pain medication is not giving enough relief while using the spirometer.  You develop fever of 100.5 F (38.1 C) or higher. SEEK IMMEDIATE MEDICAL CARE IF:   You cough up bloody sputum that had not been present before.  You develop fever of 102 F (38.9 C) or greater.  You develop worsening pain at or near the incision site. MAKE SURE YOU:   Understand these instructions.  Will watch your condition.  Will get help right away if you are not doing well or get worse. Document Released: 09/23/2006 Document Revised: 08/05/2011 Document Reviewed: 11/24/2006 [facility] Patient Information 2014 [place], [place].   ________________________________________________________________________

## 2018-04-29 NOTE — Telephone Encounter (Signed)
Patient aware this Rx is ready for her at the front desk

## 2018-05-06 ENCOUNTER — Other Ambulatory Visit: Payer: Self-pay

## 2018-05-06 ENCOUNTER — Encounter (HOSPITAL_COMMUNITY)
Admission: RE | Admit: 2018-05-06 | Discharge: 2018-05-06 | Disposition: A | Discharge status: Home / Self Care | Payer: BC Managed Care – PPO | Source: Ambulatory Visit | Attending: Orthopaedic Surgery | Admitting: Orthopaedic Surgery

## 2018-05-06 ENCOUNTER — Encounter (HOSPITAL_COMMUNITY): Payer: Self-pay

## 2018-05-06 DIAGNOSIS — Z01812 Encounter for preprocedural laboratory examination: Secondary | ICD-10-CM | POA: Insufficient documentation

## 2018-05-06 HISTORY — DX: Other complications of anesthesia, initial encounter: T88.59XA

## 2018-05-06 HISTORY — DX: Other specified postprocedural states: Z98.890

## 2018-05-06 HISTORY — DX: Prediabetes: R73.03

## 2018-05-06 HISTORY — DX: Anxiety disorder, unspecified: F41.9

## 2018-05-06 HISTORY — DX: Nausea with vomiting, unspecified: R11.2

## 2018-05-06 HISTORY — DX: Unspecified osteoarthritis, unspecified site: M19.90

## 2018-05-06 HISTORY — DX: Adverse effect of unspecified anesthetic, initial encounter: T41.45XA

## 2018-05-06 LAB — SURGICAL PCR SCREEN
MRSA, PCR: NEGATIVE
STAPHYLOCOCCUS AUREUS: NEGATIVE

## 2018-05-06 LAB — CBC
HCT: 37.9 % (ref 36.0–46.0)
Hemoglobin: 11.7 g/dL — ABNORMAL LOW (ref 12.0–15.0)
MCH: 26.7 pg (ref 26.0–34.0)
MCHC: 30.9 g/dL (ref 30.0–36.0)
MCV: 86.5 fL (ref 80.0–100.0)
NRBC: 0 % (ref 0.0–0.2)
Platelets: 252 10*3/uL (ref 150–400)
RBC: 4.38 MIL/uL (ref 3.87–5.11)
RDW: 14.3 % (ref 11.5–15.5)
WBC: 4.8 10*3/uL (ref 4.0–10.5)

## 2018-05-06 LAB — HEMOGLOBIN A1C
HEMOGLOBIN A1C: 5.8 % — AB (ref 4.8–5.6)
Mean Plasma Glucose: 119.76 mg/dL

## 2018-05-08 ENCOUNTER — Inpatient Hospital Stay (HOSPITAL_COMMUNITY): Payer: BC Managed Care – PPO | Admitting: Certified Registered Nurse Anesthetist

## 2018-05-08 ENCOUNTER — Inpatient Hospital Stay (HOSPITAL_COMMUNITY): Payer: BC Managed Care – PPO

## 2018-05-08 ENCOUNTER — Encounter (HOSPITAL_COMMUNITY)
Admission: RE | Disposition: A | Discharge status: Home / Self Care | Payer: Self-pay | Source: Home / Self Care | Attending: Orthopaedic Surgery

## 2018-05-08 ENCOUNTER — Encounter (HOSPITAL_COMMUNITY): Payer: Self-pay | Admitting: *Deleted

## 2018-05-08 ENCOUNTER — Other Ambulatory Visit: Payer: Self-pay

## 2018-05-08 ENCOUNTER — Inpatient Hospital Stay (HOSPITAL_COMMUNITY)
Admission: RE | Admit: 2018-05-08 | Discharge: 2018-05-11 | DRG: 470 | Disposition: A | Discharge status: Home / Self Care | Payer: BC Managed Care – PPO | Attending: Orthopaedic Surgery | Admitting: Orthopaedic Surgery

## 2018-05-08 DIAGNOSIS — Z90710 Acquired absence of both cervix and uterus: Secondary | ICD-10-CM

## 2018-05-08 DIAGNOSIS — D62 Acute posthemorrhagic anemia: Secondary | ICD-10-CM | POA: Diagnosis not present

## 2018-05-08 DIAGNOSIS — F419 Anxiety disorder, unspecified: Secondary | ICD-10-CM | POA: Diagnosis present

## 2018-05-08 DIAGNOSIS — R7303 Prediabetes: Secondary | ICD-10-CM | POA: Diagnosis present

## 2018-05-08 DIAGNOSIS — Z8249 Family history of ischemic heart disease and other diseases of the circulatory system: Secondary | ICD-10-CM

## 2018-05-08 DIAGNOSIS — M1612 Unilateral primary osteoarthritis, left hip: Principal | ICD-10-CM

## 2018-05-08 DIAGNOSIS — Z96642 Presence of left artificial hip joint: Secondary | ICD-10-CM

## 2018-05-08 DIAGNOSIS — E785 Hyperlipidemia, unspecified: Secondary | ICD-10-CM | POA: Diagnosis present

## 2018-05-08 DIAGNOSIS — M8580 Other specified disorders of bone density and structure, unspecified site: Secondary | ICD-10-CM | POA: Diagnosis present

## 2018-05-08 DIAGNOSIS — Z881 Allergy status to other antibiotic agents status: Secondary | ICD-10-CM

## 2018-05-08 DIAGNOSIS — M25552 Pain in left hip: Secondary | ICD-10-CM

## 2018-05-08 DIAGNOSIS — Z87891 Personal history of nicotine dependence: Secondary | ICD-10-CM | POA: Diagnosis not present

## 2018-05-08 HISTORY — DX: Unilateral primary osteoarthritis, left hip: M16.12

## 2018-05-08 HISTORY — DX: Presence of left artificial hip joint: Z96.642

## 2018-05-08 HISTORY — PX: TOTAL HIP ARTHROPLASTY: SHX124

## 2018-05-08 LAB — GLUCOSE, CAPILLARY: Glucose-Capillary: 100 mg/dL — ABNORMAL HIGH (ref 70–99)

## 2018-05-08 SURGERY — ARTHROPLASTY, HIP, TOTAL, ANTERIOR APPROACH
Anesthesia: Monitor Anesthesia Care | Site: Hip | Laterality: Left

## 2018-05-08 MED ORDER — LIDOCAINE HCL (CARDIAC) PF 100 MG/5ML IV SOSY
PREFILLED_SYRINGE | INTRAVENOUS | Status: DC | PRN
  Administered 2018-05-08: 20 mg via INTRATRACHEAL

## 2018-05-08 MED ORDER — ALUM & MAG HYDROXIDE-SIMETH 200-200-20 MG/5ML PO SUSP: 30.0000 mL | ORAL | Status: DC | PRN

## 2018-05-08 MED ORDER — VITAMIN D 25 MCG (1000 UNIT) PO TABS
1000.0000 [IU] | ORAL_TABLET | Freq: Every day | ORAL | Status: DC
  Administered 2018-05-08 – 2018-05-11 (×4): 1000 [IU] via ORAL
  Filled 2018-05-08 (×4): qty 1

## 2018-05-08 MED ORDER — CHLORHEXIDINE GLUCONATE 4 % EX LIQD: 60.0000 mL | Freq: Once | CUTANEOUS | Status: DC

## 2018-05-08 MED ORDER — BISACODYL 10 MG RE SUPP: 10.0000 mg | Freq: Every day | RECTAL | Status: DC | PRN

## 2018-05-08 MED ORDER — LACTATED RINGERS IV SOLN
INTRAVENOUS | Status: DC
  Administered 2018-05-08 (×2): via INTRAVENOUS

## 2018-05-08 MED ORDER — PANTOPRAZOLE SODIUM 40 MG PO TBEC
40.0000 mg | DELAYED_RELEASE_TABLET | Freq: Every day | ORAL | Status: DC
  Administered 2018-05-08 – 2018-05-11 (×4): 40 mg via ORAL
  Filled 2018-05-08 (×4): qty 1

## 2018-05-08 MED ORDER — ONDANSETRON HCL 4 MG/2ML IJ SOLN
INTRAMUSCULAR | Status: DC | PRN
  Administered 2018-05-08: 4 mg via INTRAVENOUS

## 2018-05-08 MED ORDER — DEXAMETHASONE SODIUM PHOSPHATE 10 MG/ML IJ SOLN
INTRAMUSCULAR | Status: DC | PRN
  Administered 2018-05-08: 8 mg via INTRAVENOUS

## 2018-05-08 MED ORDER — ZOLPIDEM TARTRATE 5 MG PO TABS
5.0000 mg | ORAL_TABLET | Freq: Every evening | ORAL | Status: DC | PRN
  Administered 2018-05-09: 5 mg via ORAL
  Filled 2018-05-08: qty 1

## 2018-05-08 MED ORDER — ACETAMINOPHEN 10 MG/ML IV SOLN: 1000.0000 mg | Freq: Once | INTRAVENOUS | Status: DC | PRN

## 2018-05-08 MED ORDER — OXYCODONE HCL 5 MG PO TABS
5.0000 mg | ORAL_TABLET | ORAL | Status: DC | PRN
  Administered 2018-05-08 – 2018-05-11 (×10): 5 mg via ORAL
  Filled 2018-05-08: qty 1
  Filled 2018-05-08: qty 2
  Filled 2018-05-08 (×4): qty 1
  Filled 2018-05-08 (×2): qty 2
  Filled 2018-05-08 (×4): qty 1

## 2018-05-08 MED ORDER — PROPOFOL 10 MG/ML IV BOLUS
INTRAVENOUS | Status: DC | PRN
  Administered 2018-05-08: 10 mg via INTRAVENOUS
  Administered 2018-05-08 (×2): 20 mg via INTRAVENOUS
  Administered 2018-05-08 (×4): 10 mg via INTRAVENOUS

## 2018-05-08 MED ORDER — CEFAZOLIN SODIUM-DEXTROSE 2-4 GM/100ML-% IV SOLN
2.0000 g | INTRAVENOUS | Status: AC
  Administered 2018-05-08: 2 g via INTRAVENOUS
  Filled 2018-05-08: qty 100

## 2018-05-08 MED ORDER — CEFAZOLIN SODIUM-DEXTROSE 1-4 GM/50ML-% IV SOLN
1.0000 g | Freq: Four times a day (QID) | INTRAVENOUS | Status: AC
  Administered 2018-05-08 (×2): 1 g via INTRAVENOUS
  Filled 2018-05-08 (×2): qty 50

## 2018-05-08 MED ORDER — ONDANSETRON HCL 4 MG PO TABS
4.0000 mg | ORAL_TABLET | Freq: Four times a day (QID) | ORAL | Status: DC | PRN
  Administered 2018-05-09: 4 mg via ORAL
  Filled 2018-05-08: qty 1

## 2018-05-08 MED ORDER — OXYCODONE HCL 5 MG PO TABS: 5.0000 mg | ORAL_TABLET | Freq: Once | ORAL | Status: DC | PRN

## 2018-05-08 MED ORDER — STERILE WATER FOR IRRIGATION IR SOLN
Status: DC | PRN
  Administered 2018-05-08: 2000 mL

## 2018-05-08 MED ORDER — PHENYLEPHRINE 40 MCG/ML (10ML) SYRINGE FOR IV PUSH (FOR BLOOD PRESSURE SUPPORT)
PREFILLED_SYRINGE | INTRAVENOUS | Status: DC | PRN
  Administered 2018-05-08 (×5): 40 ug via INTRAVENOUS

## 2018-05-08 MED ORDER — PROPOFOL 10 MG/ML IV BOLUS
INTRAVENOUS | Status: AC
  Filled 2018-05-08: qty 20

## 2018-05-08 MED ORDER — METHOCARBAMOL 500 MG PO TABS
500.0000 mg | ORAL_TABLET | Freq: Four times a day (QID) | ORAL | Status: DC | PRN
  Administered 2018-05-09 – 2018-05-10 (×4): 500 mg via ORAL
  Filled 2018-05-08 (×4): qty 1

## 2018-05-08 MED ORDER — BUPIVACAINE IN DEXTROSE 0.75-8.25 % IT SOLN
INTRATHECAL | Status: DC | PRN
  Administered 2018-05-08: 1.8 mL via INTRATHECAL

## 2018-05-08 MED ORDER — POLYETHYLENE GLYCOL 3350 17 G PO PACK
17.0000 g | PACK | Freq: Every day | ORAL | Status: DC | PRN
  Administered 2018-05-11: 17 g via ORAL
  Filled 2018-05-08: qty 1

## 2018-05-08 MED ORDER — DEXAMETHASONE SODIUM PHOSPHATE 10 MG/ML IJ SOLN
INTRAMUSCULAR | Status: AC
  Filled 2018-05-08: qty 1

## 2018-05-08 MED ORDER — PROPOFOL 500 MG/50ML IV EMUL
INTRAVENOUS | Status: DC | PRN
  Administered 2018-05-08: 20 ug/kg/min via INTRAVENOUS

## 2018-05-08 MED ORDER — ACETAMINOPHEN 160 MG/5ML PO SOLN: 1000.0000 mg | Freq: Once | ORAL | Status: DC | PRN

## 2018-05-08 MED ORDER — ACETAMINOPHEN 500 MG PO TABS: 1000.0000 mg | ORAL_TABLET | Freq: Once | ORAL | Status: DC | PRN

## 2018-05-08 MED ORDER — METOCLOPRAMIDE HCL 5 MG/ML IJ SOLN
5.0000 mg | Freq: Three times a day (TID) | INTRAMUSCULAR | Status: DC | PRN
  Administered 2018-05-08: 10 mg via INTRAVENOUS
  Filled 2018-05-08: qty 2

## 2018-05-08 MED ORDER — SODIUM CHLORIDE 0.9 % IV SOLN
INTRAVENOUS | Status: DC
  Administered 2018-05-08: 16:00:00 via INTRAVENOUS

## 2018-05-08 MED ORDER — BIOTIN 1000 MCG PO TABS: 1000.0000 ug | ORAL_TABLET | Freq: Every day | ORAL | Status: DC

## 2018-05-08 MED ORDER — OXYCODONE HCL 5 MG PO TABS: 10.0000 mg | ORAL_TABLET | ORAL | Status: DC | PRN

## 2018-05-08 MED ORDER — ONDANSETRON HCL 4 MG/2ML IJ SOLN
4.0000 mg | Freq: Four times a day (QID) | INTRAMUSCULAR | Status: DC | PRN
  Administered 2018-05-09 – 2018-05-10 (×3): 4 mg via INTRAVENOUS
  Filled 2018-05-08 (×3): qty 2

## 2018-05-08 MED ORDER — FENTANYL CITRATE (PF) 100 MCG/2ML IJ SOLN: 25.0000 ug | INTRAMUSCULAR | Status: DC | PRN

## 2018-05-08 MED ORDER — METHOCARBAMOL 500 MG IVPB - SIMPLE MED
INTRAVENOUS | Status: AC
  Filled 2018-05-08: qty 50

## 2018-05-08 MED ORDER — LIDOCAINE 2% (20 MG/ML) 5 ML SYRINGE
INTRAMUSCULAR | Status: AC
  Filled 2018-05-08: qty 5

## 2018-05-08 MED ORDER — MENTHOL 3 MG MT LOZG: 1.0000 | LOZENGE | OROMUCOSAL | Status: DC | PRN

## 2018-05-08 MED ORDER — MIDAZOLAM HCL 2 MG/2ML IJ SOLN
INTRAMUSCULAR | Status: AC
  Filled 2018-05-08: qty 2

## 2018-05-08 MED ORDER — PHENYLEPHRINE 40 MCG/ML (10ML) SYRINGE FOR IV PUSH (FOR BLOOD PRESSURE SUPPORT)
PREFILLED_SYRINGE | INTRAVENOUS | Status: AC
  Filled 2018-05-08: qty 10

## 2018-05-08 MED ORDER — MIDAZOLAM HCL 2 MG/2ML IJ SOLN
INTRAMUSCULAR | Status: DC | PRN
  Administered 2018-05-08: 2 mg via INTRAVENOUS

## 2018-05-08 MED ORDER — ACETAMINOPHEN 325 MG PO TABS
325.0000 mg | ORAL_TABLET | Freq: Four times a day (QID) | ORAL | Status: DC | PRN
  Administered 2018-05-09 – 2018-05-10 (×3): 650 mg via ORAL
  Filled 2018-05-08 (×3): qty 2

## 2018-05-08 MED ORDER — ONDANSETRON HCL 4 MG/2ML IJ SOLN
INTRAMUSCULAR | Status: AC
  Filled 2018-05-08: qty 2

## 2018-05-08 MED ORDER — SODIUM CHLORIDE 0.9 % IR SOLN
Status: DC | PRN
  Administered 2018-05-08: 1000 mL

## 2018-05-08 MED ORDER — LORAZEPAM 0.5 MG PO TABS
0.5000 mg | ORAL_TABLET | Freq: Three times a day (TID) | ORAL | Status: DC | PRN
  Administered 2018-05-08 – 2018-05-09 (×2): 0.5 mg via ORAL
  Filled 2018-05-08 (×2): qty 1

## 2018-05-08 MED ORDER — PHENOL 1.4 % MT LIQD: 1.0000 | OROMUCOSAL | Status: DC | PRN

## 2018-05-08 MED ORDER — ASPIRIN 81 MG PO CHEW
81.0000 mg | CHEWABLE_TABLET | Freq: Two times a day (BID) | ORAL | Status: DC
  Administered 2018-05-08 – 2018-05-11 (×6): 81 mg via ORAL
  Filled 2018-05-08 (×6): qty 1

## 2018-05-08 MED ORDER — FENTANYL CITRATE (PF) 100 MCG/2ML IJ SOLN
INTRAMUSCULAR | Status: AC
  Filled 2018-05-08: qty 2

## 2018-05-08 MED ORDER — METHOCARBAMOL 500 MG IVPB - SIMPLE MED
500.0000 mg | Freq: Four times a day (QID) | INTRAVENOUS | Status: DC | PRN
  Administered 2018-05-08: 500 mg via INTRAVENOUS
  Filled 2018-05-08 (×2): qty 50

## 2018-05-08 MED ORDER — FENTANYL CITRATE (PF) 100 MCG/2ML IJ SOLN
INTRAMUSCULAR | Status: DC | PRN
  Administered 2018-05-08: 100 ug via INTRATHECAL

## 2018-05-08 MED ORDER — OXYCODONE HCL 5 MG/5ML PO SOLN
5.0000 mg | Freq: Once | ORAL | Status: DC | PRN
  Filled 2018-05-08: qty 5

## 2018-05-08 MED ORDER — DOCUSATE SODIUM 100 MG PO CAPS
100.0000 mg | ORAL_CAPSULE | Freq: Two times a day (BID) | ORAL | Status: DC
  Administered 2018-05-08 – 2018-05-11 (×6): 100 mg via ORAL
  Filled 2018-05-08 (×6): qty 1

## 2018-05-08 MED ORDER — METOCLOPRAMIDE HCL 5 MG PO TABS
5.0000 mg | ORAL_TABLET | Freq: Three times a day (TID) | ORAL | Status: DC | PRN
  Administered 2018-05-09: 10 mg via ORAL
  Filled 2018-05-08: qty 2

## 2018-05-08 MED ORDER — TRANEXAMIC ACID-NACL 1000-0.7 MG/100ML-% IV SOLN
1000.0000 mg | INTRAVENOUS | Status: AC
  Administered 2018-05-08: 1000 mg via INTRAVENOUS
  Filled 2018-05-08: qty 100

## 2018-05-08 MED ORDER — DIPHENHYDRAMINE HCL 12.5 MG/5ML PO ELIX
12.5000 mg | ORAL_SOLUTION | ORAL | Status: DC | PRN
  Administered 2018-05-08: 25 mg via ORAL
  Filled 2018-05-08: qty 10

## 2018-05-08 MED ORDER — HYDROMORPHONE HCL 1 MG/ML IJ SOLN
0.5000 mg | INTRAMUSCULAR | Status: DC | PRN
  Administered 2018-05-08 – 2018-05-09 (×4): 1 mg via INTRAVENOUS
  Filled 2018-05-08 (×4): qty 1

## 2018-05-08 SURGICAL SUPPLY — 43 items
ACETAB CUP W GRIPTION 54MM (Plate) ×1 IMPLANT
ACETAB CUP W/GRIPTION 54 (Plate) ×2 IMPLANT
APL SKNCLS STERI-STRIP NONHPOA (GAUZE/BANDAGES/DRESSINGS) ×1
BAG SPEC THK2 15X12 ZIP CLS (MISCELLANEOUS)
BAG ZIPLOCK 12X15 (MISCELLANEOUS) IMPLANT
BENZOIN TINCTURE PRP APPL 2/3 (GAUZE/BANDAGES/DRESSINGS) ×2 IMPLANT
BLADE SAW SGTL 18X1.27X75 (BLADE) ×2 IMPLANT
BLADE SAW SGTL 18X1.27X75MM (BLADE) ×1
BLADE SURG SZ10 CARB STEEL (BLADE) ×6 IMPLANT
CLOSURE WOUND 1/2 X4 (GAUZE/BANDAGES/DRESSINGS) ×1
COVER PERINEAL POST (MISCELLANEOUS) ×3 IMPLANT
COVER SURGICAL LIGHT HANDLE (MISCELLANEOUS) ×3 IMPLANT
COVER WAND RF STERILE (DRAPES) ×2 IMPLANT
CUP ACETAB W/GRIPTION 54 (Plate) IMPLANT
DRAPE STERI IOBAN 125X83 (DRAPES) ×3 IMPLANT
DRAPE U-SHAPE 47X51 STRL (DRAPES) ×6 IMPLANT
DRSG AQUACEL AG ADV 3.5X10 (GAUZE/BANDAGES/DRESSINGS) ×3 IMPLANT
DURAPREP 26ML APPLICATOR (WOUND CARE) ×3 IMPLANT
ELECT REM PT RETURN 15FT ADLT (MISCELLANEOUS) ×3 IMPLANT
GAUZE XEROFORM 1X8 LF (GAUZE/BANDAGES/DRESSINGS) IMPLANT
GLOVE BIO SURGEON STRL SZ7.5 (GLOVE) ×3 IMPLANT
GLOVE BIOGEL PI IND STRL 8 (GLOVE) ×2 IMPLANT
GLOVE BIOGEL PI INDICATOR 8 (GLOVE) ×4
GLOVE ECLIPSE 8.0 STRL XLNG CF (GLOVE) ×3 IMPLANT
GOWN STRL REUS W/TWL XL LVL3 (GOWN DISPOSABLE) ×6 IMPLANT
HANDPIECE INTERPULSE COAX TIP (DISPOSABLE) ×3
HEAD CERAMIC DELTA 36 PLUS 1.5 (Hips) ×2 IMPLANT
HOLDER FOLEY CATH W/STRAP (MISCELLANEOUS) ×3 IMPLANT
LINER NEUTRAL 36ID 54OD (Liner) ×2 IMPLANT
PACK ANTERIOR HIP CUSTOM (KITS) ×3 IMPLANT
SET HNDPC FAN SPRY TIP SCT (DISPOSABLE) ×1 IMPLANT
STAPLER VISISTAT 35W (STAPLE) IMPLANT
STEM CORAIL KA11 (Stem) ×2 IMPLANT
STRIP CLOSURE SKIN 1/2X4 (GAUZE/BANDAGES/DRESSINGS) ×1 IMPLANT
SUT ETHIBOND NAB CT1 #1 30IN (SUTURE) ×3 IMPLANT
SUT MNCRL AB 4-0 PS2 18 (SUTURE) ×2 IMPLANT
SUT VIC AB 0 CT1 36 (SUTURE) ×3 IMPLANT
SUT VIC AB 1 CT1 36 (SUTURE) ×3 IMPLANT
SUT VIC AB 2-0 CT1 27 (SUTURE) ×6
SUT VIC AB 2-0 CT1 TAPERPNT 27 (SUTURE) ×2 IMPLANT
TRAY FOLEY CATH 14FRSI W/METER (CATHETERS) ×2 IMPLANT
TRAY FOLEY MTR SLVR 16FR STAT (SET/KITS/TRAYS/PACK) ×1 IMPLANT
YANKAUER SUCT BULB TIP 10FT TU (MISCELLANEOUS) ×3 IMPLANT

## 2018-05-08 NOTE — Op Note (Signed)
NAME: Katelyn Copeland, Katelyn Copeland MEDICAL RECORD ZO:[id number] ACCOUNT [id number] DATE OF BIRTH:12/02/1955 FACILITY: [facility] LOCATION: [id number] PHYSICIAN:Jull Harral Aretha Parrot, MD  OPERATIVE REPORT  DATE OF PROCEDURE:  05/08/2018  PREOPERATIVE DIAGNOSIS:  Primary osteoarthritis and degenerative joint disease, left hip.  POSTOPERATIVE DIAGNOSIS:  Primary osteoarthritis and degenerative joint disease, left hip.  PROCEDURE:  Left total hip arthroplasty, direct anterior approach.  IMPLANTS:  DePuy Sector Gription acetabular component size 54, size 36+0 neutral polyethylene liner, size 11 Corail femoral component with standard offset, size 36+1.5 ceramic hip ball.  SURGEON:  Vanita Panda. Magnus Ivan, MD  ASSISTANT:  Richardean Canal, PA-C  ANESTHESIA:  Spinal.  ANTIBIOTICS:  Two grams IV Ancef.  ESTIMATED BLOOD LOSS:  175 mL.  COMPLICATIONS:  None.  INDICATIONS:  The patient is a 62 year old longtime patient of mine who has been having problems with left hip arthritis.  Her x-rays show complete loss of joint space and significant arthritis in the left hip.  She has tried and failed all forms of  conservative treatment.  Her pain has now become daily.  It is detrimentally affecting her activities of daily living, quality of life, and her mobility.  At this point, she does wish to proceed with a total hip arthroplasty through direct anterior  approach.  I talked to her in detail about the risk of acute blood loss anemia, nerve or vessel injury, fracture, infection, dislocation, DVT and implant failure.  She understands our goals are to decrease pain, improve mobility and overall improve  quality of life.  DESCRIPTION OF PROCEDURE:  After informed consent was obtained, appropriate left hip was marked.  She was brought to the operating room and sat up on the stretcher where spinal anesthesia was then obtained.  She was then laid in the supine position on a  stretcher.  Foley catheter was placed.  I  meticulously assessed her leg lengths and found that she was equal.  I placed traction boots on both her feet.  Next she was placed supine on the Hana fracture table with the perineal post in place and both legs  in line skeletal traction device and no traction applied.  I assessed her left hip again radiographically intraoperative so we could obtain a good preoperative film for judging leg length.  We then prepped her left hip with DuraPrep and sterile drapes.   A time-out was called to identify correct patient, correct left hip.  I then made an incision just inferior and posterior to the anterior superior iliac spine and carried this obliquely down the leg.  I dissected down tensor fascia lata muscle.  Tensor  fascia was then divided longitudinally to proceed with direct anterior approach to the hip.  We identified and cauterized circumflex vessels and identified the hip capsule, opened the hip capsule in an L-type format, finding moderate joint effusion and  significant periarticular osteophytes around the femoral head and neck.  We placed Cobra retractors around the medial and lateral femoral neck and then made our femoral neck cut with an oscillating saw proximal to the lesser trochanter and completed this  with an osteotome.  We placed a corkscrew guide in the femoral head and removed the femoral heads entirety and found a large section devoid of cartilage.  We then placed a bent Hohmann over the medial acetabular rim and removed remnants of the  acetabular labrum and other debris.  We then began reaming from a size 44 reamer in stepwise increments up to a size 53 with all reamers under  direct visualization, the last reamer under direct fluoroscopy, so we could obtain our depth of reaming and our  inclination and anteversion.  We then placed the real DePuy Sector Gription acetabular component size 54 and a 36+0 neutral polyethylene liner for that size acetabular component.  Attention was then turned to  the femur.  With the leg externally rotated  to 120 degrees, extended and adducted and we are to place a Mueller retractor medially and Hohman retractor behind the greater trochanter, released lateral joint capsule and used a box-cutting osteotome to enter femoral canal and a rongeur to lateralize  then began broaching from size 8 broach using our broaching system going up to a size 11.  With the 11 in place, we trialed a standard offset femoral neck and 36+1.5 hip ball, reduced this in the acetabulum.  I was pleased with leg length, offset, range  of motion and stability assessed clinically and radiographically.  We then dislocated the hip and removed the trial components.  I placed the real Corail femoral component size 11 with standard offset and the real 36+1.5 ceramic hip ball and again  reduced in the acetabulum and I was pleased with stability.  We then irrigated the soft tissue with normal saline solution using pulsatile lavage.  We closed the joint capsule with interrupted #1 Ethibond suture, followed by running 0 Vicryl and tensor  fascia, 0 Vicryl in the deep tissue, 2-0 Vicryl subcutaneous tissue, 4-0 Monocryl subcuticular stitch and Steri-Strips on the skin.  An Aquacel dressing was applied.  She was taken off the Hana table and taken to recovery room in stable condition.  All  final counts were correct.  There were no complications noted.  Of note, Rexene EdisonGil Clark, PA-C, assisted the entire case.  His assistance was crucial for facilitating all aspects of this case.  TN/NUANCE  D:05/08/2018 T:05/08/2018 JOB:[id number]

## 2018-05-08 NOTE — Brief Op Note (Signed)
05/08/2018  12:58 PM  PATIENT:  Katelyn Copeland  62 y.o. female  PRE-OPERATIVE DIAGNOSIS:  Left Hip Osteoarthritis  POST-OPERATIVE DIAGNOSIS:  Left Hip Osteoarthritis  PROCEDURE:  Procedure(s): LEFT TOTAL HIP ARTHROPLASTY ANTERIOR APPROACH (Left)  SURGEON:  Surgeon(s) and Role:    Kathryne Hitch* Jade Burkard Y, MD - Primary  PHYSICIAN ASSISTANT: Rexene EdisonGil Clark, PA-C  ANESTHESIA:   spinal  EBL:  175 mL   COUNTS:  YES  TOURNIQUET:  * No tourniquets in log *  DICTATION: .Other Dictation: Dictation Number [id number]  PLAN OF CARE: Admit to inpatient   PATIENT DISPOSITION:  PACU - hemodynamically stable.   Delay start of Pharmacological VTE agent (>24hrs) due to surgical blood loss or risk of bleeding: no

## 2018-05-08 NOTE — Transfer of Care (Signed)
Immediate Anesthesia Transfer of Care Note  Patient: Katelyn PalingCynthia C Alles  Procedure(s) Performed: LEFT TOTAL HIP ARTHROPLASTY ANTERIOR APPROACH (Left Hip)  Patient Location: PACU  Anesthesia Type:Spinal  Level of Consciousness: awake, alert , oriented and patient cooperative  Airway & Oxygen Therapy: Patient Spontanous Breathing and Patient connected to face mask oxygen  Post-op Assessment: Report given to RN and Post -op Vital signs reviewed and stable  Post vital signs: Reviewed and stable  Last Vitals:  Vitals Value Taken Time  BP 101/73 05/08/2018  1:24 PM  Temp    Pulse 65 05/08/2018  1:26 PM  Resp 15 05/08/2018  1:26 PM  SpO2 100 % 05/08/2018  1:26 PM  Vitals shown include unvalidated device data.  Last Pain:  Vitals:   05/08/18 0929  TempSrc:   PainSc: 0-No pain      Patients Stated Pain Goal: 3 (05/08/18 0929)  Complications: No apparent anesthesia complications

## 2018-05-08 NOTE — Anesthesia Preprocedure Evaluation (Addendum)
Anesthesia Evaluation  Patient identified by MRN, date of birth, ID band Patient awake    Reviewed: Allergy & Precautions, NPO status , Patient's Chart, lab work & pertinent test results  History of Anesthesia Complications (+) PONV and history of anesthetic complications  Airway Mallampati: II  TM Distance: >3 FB Neck ROM: Full    Dental  (+) Teeth Intact,    Pulmonary former smoker,    breath sounds clear to auscultation       Cardiovascular negative cardio ROS   Rhythm:Regular     Neuro/Psych PSYCHIATRIC DISORDERS Anxiety negative neurological ROS     GI/Hepatic negative GI ROS, Neg liver ROS,   Endo/Other  negative endocrine ROS  Renal/GU negative Renal ROS     Musculoskeletal  (+) Arthritis ,   Abdominal   Peds  Hematology negative hematology ROS (+)   Anesthesia Other Findings   Reproductive/Obstetrics                            Anesthesia Physical Anesthesia Plan  ASA: II  Anesthesia Plan: MAC and Spinal   Post-op Pain Management:    Induction:   PONV Risk Score and Plan: 3 and Propofol infusion and Treatment may vary due to age or medical condition  Airway Management Planned: Nasal Cannula  Additional Equipment: None  Intra-op Plan:   Post-operative Plan:   Informed Consent: I have reviewed the patients History and Physical, chart, labs and discussed the procedure including the risks, benefits and alternatives for the proposed anesthesia with the patient or authorized representative who has indicated his/her understanding and acceptance.   Dental advisory given  Plan Discussed with: CRNA  Anesthesia Plan Comments:         Anesthesia Quick Evaluation

## 2018-05-08 NOTE — H&P (Signed)
TOTAL HIP ADMISSION H&P  Patient is admitted for left total hip arthroplasty.  Subjective:  Chief Complaint: left hip pain  HPI: Katelyn Copeland, 62 y.o. female, has a history of pain and functional disability in the left hip(s) due to arthritis and patient has failed non-surgical conservative treatments for greater than 12 weeks to include NSAID's and/or analgesics, corticosteriod injections, flexibility and strengthening excercises, weight reduction as appropriate and activity modification.  Onset of symptoms was gradual starting 3 years ago with gradually worsening course since that time.The patient noted no past surgery on the left hip(s).  Patient currently rates pain in the left hip at 10 out of 10 with activity. Patient has night pain, worsening of pain with activity and weight bearing, pain that interfers with activities of daily living, pain with passive range of motion and crepitus. Patient has evidence of subchondral sclerosis, periarticular osteophytes and joint space narrowing by imaging studies. This condition presents safety issues increasing the risk of falls.  There is no current active infection.  Patient Active Problem List   Diagnosis Date Noted  . Unilateral primary osteoarthritis, left hip 05/08/2018  . Urinary frequency 09/23/2011  . Vaginal discharge 09/23/2011  . Incontinent of urine 09/23/2011  . 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 Anxiety   . Arthritis   . Complication of anesthesia   . H/O cardiovascular stress test 2011   normal myo stsiudy  . H/O Doppler ultrasound 2003   normal study  . H/O echocardiogram 2011   normal study  . H/O osteopenia   . H/O: hematuria   . Hyperlipidemia   . PONV (postoperative nausea and vomiting)   . Pre-diabetes   . Shingles   . Vaginal prolapse    h/o    Past Surgical History:  Procedure Laterality Date  . ABDOMINAL HYSTERECTOMY    . COLONOSCOPY    . JOINT REPLACEMENT     Left  hip Allie BossierChris Deforest Maiden 05-08-18  . tubes tied      Current Facility-Administered Medications  Medication Dose Route Frequency Provider Last Rate Last Dose  . ceFAZolin (ANCEF) IVPB 2g/100 mL premix  2 g Intravenous On Call to OR Kirtland Bouchardlark, Gilbert W, PA-C      . chlorhexidine (HIBICLENS) 4 % liquid 4 application  60 mL Topical Once Kirtland Bouchardlark, Gilbert W, PA-C      . lactated ringers infusion   Intravenous Continuous Val EagleMoser, Ura Yingling, MD 50 mL/hr at 05/08/18 0935    . tranexamic acid (CYKLOKAPRON) IVPB 1,000 mg  1,000 mg Intravenous To OR Kirtland Bouchardlark, Gilbert W, PA-C       Allergies  Allergen Reactions  . Erythromycin Nausea And Vomiting    Social History   Tobacco Use  . Smoking status: Former Smoker    Types: Cigarettes  . Smokeless tobacco: Never Used  . Tobacco comment: quit when [age] -62 years old  Substance Use Topics  . Alcohol use: No    Comment: rare    Family History  Problem Relation Age of Onset  . Heart disease Paternal Grandmother   . Heart disease Maternal Grandfather   . Heart disease Father   . Heart disease Mother      Review of Systems  Musculoskeletal: Positive for joint pain.  All other systems reviewed and are negative.   Objective:  Physical Exam  Constitutional: She is oriented to person, place, and time. She appears well-developed and well-nourished.  HENT:  Head: Normocephalic and atraumatic.  Eyes:  Pupils are equal, round, and reactive to light. EOM are normal.  Neck: Normal range of motion. Neck supple.  Cardiovascular: Normal rate and regular rhythm.  Respiratory: Effort normal and breath sounds normal.  GI: Soft. Bowel sounds are normal.  Musculoskeletal:     Left hip: She exhibits decreased range of motion, decreased strength, tenderness and bony tenderness.  Neurological: She is alert and oriented to person, place, and time.  Skin: Skin is warm and dry.  Psychiatric: She has a normal mood and affect.    Vital signs in last 24 hours: Temp:  [97.4 F  (36.3 C)] 97.4 F (36.3 C) (12/13 0907) Pulse Rate:  [76] 76 (12/13 0907) Resp:  [19] 19 (12/13 0907) BP: (102)/(71) 102/71 (12/13 0907) SpO2:  [98 %] 98 % (12/13 0907) Weight:  [78.5 kg] 78.5 kg (12/13 0929)  Labs:   Estimated body mass index is 26.7 kg/m as calculated from the following:   Height as of this encounter: 5' 7.5" (1.715 m).   Weight as of this encounter: 78.5 kg.   Imaging Review Plain radiographs demonstrate severe degenerative joint disease of the left hip(s). The bone quality appears to be good for age and reported activity level.    Preoperative templating of the joint replacement has been completed, documented, and submitted to the Operating Room personnel in order to optimize intra-operative equipment management.     Assessment/Plan:  End stage arthritis, left hip(s)  The patient history, physical examination, clinical judgement of the provider and imaging studies are consistent with end stage degenerative joint disease of the left hip(s) and total hip arthroplasty is deemed medically necessary. The treatment options including medical management, injection therapy, arthroscopy and arthroplasty were discussed at length. The risks and benefits of total hip arthroplasty were presented and reviewed. The risks due to aseptic loosening, infection, stiffness, dislocation/subluxation,  thromboembolic complications and other imponderables were discussed.  The patient acknowledged the explanation, agreed to proceed with the plan and consent was signed. Patient is being admitted for inpatient treatment for surgery, pain control, PT, OT, prophylactic antibiotics, VTE prophylaxis, progressive ambulation and ADL's and discharge planning.The patient is planning to be discharged home with home health services

## 2018-05-08 NOTE — Evaluation (Signed)
Physical Therapy Evaluation Patient Details Name: Katelyn PalingCynthia C Azizi MRN: [id number] DOB: 11/14/1955 Today's Date: 05/08/2018   History of Present Illness  62 yo female s/p L DA-THA on 05/08/18. PMH includes OA, anxiety, osteopenia, HLD, pre-DM.  Clinical Impression  Pt presents with L hip pain, difficulty performing mobility tasks, and decreased activity tolerance due to pain and N/V this session. Pt to benefit from acute PT to address deficits. Pt performed sit to stand transfer with min assist, unable to ambulate due to N/V. PT to progress mobility as tolerated, and will continue to follow acutely.      Follow Up Recommendations Follow surgeon's recommendation for DC plan and follow-up therapies;Supervision for mobility/OOB(HHPT )    Equipment Recommendations  Rolling walker with 5" wheels    Recommendations for Other Services       Precautions / Restrictions Precautions Precautions: Fall Restrictions Weight Bearing Restrictions: No Other Position/Activity Restrictions: WBAT       Mobility  Bed Mobility Overal bed mobility: Needs Assistance Bed Mobility: Supine to Sit;Sit to Supine     Supine to sit: HOB elevated;Min guard Sit to supine: Min assist;HOB elevated   General bed mobility comments: Min guard for supine to sit for safety, verbal cuing provided for sequencing. min assist for sit to supine for bringing LLE back into bed.   Transfers Overall transfer level: Needs assistance Equipment used: Rolling walker (2 wheeled) Transfers: Sit to/from Stand Sit to Stand: Min assist;From elevated surface         General transfer comment: Min assist for power up and steadying upon standing. Upon standing, pt reporting feeling lightheaded and nauseous. PT instructed pt to sit back down, attempted to take BP but unsuccessful. Pt with emesis x2 sitting EOB. PT assisted pt in laying back down, cool cloth applied to forehead.   Ambulation/Gait Ambulation/Gait assistance: (NT  - pt with N/V)              Stairs            Wheelchair Mobility    Modified Rankin (Stroke Patients Only)       Balance Overall balance assessment: Mild deficits observed, not formally tested                                           Pertinent Vitals/Pain Pain Assessment: 0-10 Pain Score: 3  Pain Location: L hip  Pain Descriptors / Indicators: Sore;Aching Pain Intervention(s): Limited activity within patient's tolerance;Repositioned;Monitored during session;RN gave pain meds during session(nausea meds )    Home Living Family/patient expects to be discharged to:: Private residence Living Arrangements: Alone Available Help at Discharge: Friend(s);Available PRN/intermittently(neighbors, friends all saying they can assist at any time ) Type of Home: House Home Access: Level entry     Home Layout: One level Home Equipment: Toilet riser      Prior Function Level of Independence: Independent         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      Hand Dominance   Dominant Hand: Right    Extremity/Trunk Assessment   Upper Extremity Assessment Upper Extremity Assessment: Overall WFL for tasks assessed    Lower Extremity Assessment Lower Extremity Assessment: Overall WFL for tasks assessed;LLE deficits/detail LLE Deficits / Details: suspected post-surgical weakness; able to perform assisted heel slides,  quad sets, ankle pumps  LLE Sensation: WNL(pt reporting LLE feels heavy )    Cervical / Trunk Assessment Cervical / Trunk Assessment: Normal  Communication   Communication: No difficulties  Cognition Arousal/Alertness: Awake/alert Behavior During Therapy: WFL for tasks assessed/performed Overall Cognitive Status: Within Functional Limits for tasks assessed                                        General Comments      Exercises      Assessment/Plan    PT Assessment Patient needs continued PT services  PT Problem List Decreased strength;Decreased mobility;Decreased activity tolerance;Decreased knowledge of use of DME;Pain       PT Treatment Interventions DME instruction;Therapeutic activities;Gait training;Patient/family education;Therapeutic exercise;Balance training;Functional mobility training    PT Goals (Current goals can be found in the Care Plan section)  Acute Rehab PT Goals PT Goal Formulation: With patient Time For Goal Achievement: 05/15/18 Potential to Achieve Goals: Good    Frequency 7X/week   Barriers to discharge        Co-evaluation               AM-PAC PT "6 Clicks" Mobility  Outcome Measure Help needed turning from your back to your side while in a flat bed without using bedrails?: A Little Help needed moving from lying on your back to sitting on the side of a flat bed without using bedrails?: A Little Help needed moving to and from a bed to a chair (including a wheelchair)?: A Little Help needed standing up from a chair using your arms (e.g., wheelchair or bedside chair)?: A Little Help needed to walk in hospital room?: A Little Help needed climbing 3-5 steps with a railing? : A Little 6 Click Score: 18    End of Session Equipment Utilized During Treatment: Gait belt;Oxygen Activity Tolerance: Other (comment)(Pt limited by N/V) Patient left: in bed;with bed alarm set;with call bell/phone within reach;with SCD's reapplied Nurse Communication: Mobility status;Other (comment)(Pt with N/V, RN supplied anti-nausea medications at end of session) PT Visit Diagnosis: Other abnormalities of gait and mobility (R26.89);Pain Pain - Right/Left: Left Pain - part of body: Hip    Time: 1743-1810 PT Time Calculation (min) (ACUTE ONLY): 27 min   Charges:   PT Evaluation $PT Eval Low Complexity: 1 Low          Rebekka Lobello Terrial Rhodes, PT Acute Rehabilitation Services Pager [phone]  Office  [phone]   Elyanah Farino D Despina Hidden 05/08/2018, 7:42 PM

## 2018-05-08 NOTE — Anesthesia Postprocedure Evaluation (Signed)
Anesthesia Post Note  Patient: Katelyn Copeland  Procedure(s) Performed: LEFT TOTAL HIP ARTHROPLASTY ANTERIOR APPROACH (Left Hip)     Patient location during evaluation: PACU Anesthesia Type: MAC and Spinal Level of consciousness: awake and alert Pain management: pain level controlled Vital Signs Assessment: post-procedure vital signs reviewed and stable Respiratory status: spontaneous breathing, nonlabored ventilation, respiratory function stable and patient connected to nasal cannula oxygen Cardiovascular status: stable and blood pressure returned to baseline Postop Assessment: no apparent nausea or vomiting and spinal receding Anesthetic complications: no    Last Vitals:  Vitals:   05/08/18 1608 05/08/18 1715  BP: 106/79 109/65  Pulse: (!) 57 65  Resp: 16 16  Temp: (!) 36.3 C 36.7 C  SpO2: 100% 100%    Last Pain:  Vitals:   05/08/18 1837  TempSrc:   PainSc: 8                  Katelyn Copeland

## 2018-05-08 NOTE — Anesthesia Procedure Notes (Signed)
Spinal  Patient location during procedure: OR Start time: 05/08/2018 11:49 AM End time: 05/08/2018 11:52 AM Staffing Anesthesiologist: Val EagleMoser, Christopher, MD Resident/CRNA: Yolonda Kidaarver, Mikiya Nebergall L, CRNA Performed: resident/CRNA  Preanesthetic Checklist Completed: patient identified, site marked, surgical consent, pre-op evaluation, timeout performed, IV checked, risks and benefits discussed and monitors and equipment checked Spinal Block Patient position: sitting Prep: DuraPrep Patient monitoring: blood pressure, continuous pulse ox, cardiac monitor and heart rate Approach: midline Location: L3-4 Injection technique: single-shot Needle Needle type: Pencan  Needle gauge: 24 G Assessment Sensory level: T6

## 2018-05-08 NOTE — Progress Notes (Signed)
PHARMACIST - PHYSICIAN ORDER COMMUNICATION  CONCERNING: P&T Medication Policy on Herbal Medications  DESCRIPTION:  This patient's order for: Biotin has been noted.  This product(s) is classified as an "herbal" or natural product. Due to a lack of definitive safety studies or FDA approval, nonstandard manufacturing practices, plus the potential risk of unknown drug-drug interactions while on inpatient medications, the Pharmacy and Therapeutics Committee does not permit the use of "herbal" or natural products of this type within [facility].   ACTION TAKEN: The pharmacy department is unable to verify this order at this time and your patient has been informed of this safety policy. Please reevaluate patient's clinical condition at discharge and address if the herbal or natural product(s) should be resumed at that time.    Greer PickerelJigna Aminta Sakurai, PharmD, BCPS Pager: [phone] 05/08/2018 3:46 PM

## 2018-05-09 LAB — BASIC METABOLIC PANEL
Anion gap: 11 (ref 5–15)
BUN: 16 mg/dL (ref 8–23)
CO2: 23 mmol/L (ref 22–32)
Calcium: 8.4 mg/dL — ABNORMAL LOW (ref 8.9–10.3)
Chloride: 107 mmol/L (ref 98–111)
Creatinine, Ser: 0.97 mg/dL (ref 0.44–1.00)
GFR calc Af Amer: >60 mL/min (ref >60)
GFR calc non Af Amer: >60 mL/min (ref >60)
Glucose, Bld: 134 mg/dL — ABNORMAL HIGH (ref 70–99)
Potassium: 4.1 mmol/L (ref 3.5–5.1)
Sodium: 141 mmol/L (ref 135–145)

## 2018-05-09 LAB — CBC
HCT: 32.3 % — ABNORMAL LOW (ref 36.0–46.0)
Hemoglobin: 9.9 g/dL — ABNORMAL LOW (ref 12.0–15.0)
MCH: 27.3 pg (ref 26.0–34.0)
MCHC: 30.7 g/dL (ref 30.0–36.0)
MCV: 89 fL (ref 80.0–100.0)
PLATELETS: 194 10*3/uL (ref 150–400)
RBC: 3.63 MIL/uL — ABNORMAL LOW (ref 3.87–5.11)
RDW: 14.4 % (ref 11.5–15.5)
WBC: 10.6 10*3/uL — ABNORMAL HIGH (ref 4.0–10.5)
nRBC: 0 % (ref 0.0–0.2)

## 2018-05-09 NOTE — Care Management Note (Addendum)
Case Management Note  Patient Details  Name: Katelyn Copeland MRN: [id number] Date of Birth: 01/21/1956  Subjective/Objective:   Left THA                 Action/Plan: NCM spoke to pt and offered choice for HH/CMS list provided and placed on chart. [facility] Contacted [facility] for DME RW and 3n1, delivered to room. Son to assist with care at home.   05/10/2018 2:30 pm NCM spoke to son via phone to explain dc plan.   05/10/2018 4:30 pm NCM spoke to pt at bedside and reviewed her dc plan. Son was not in room at time of visit. Pt states she will be ready for dc on 12/16. States she is feeling better.   Expected Discharge Date:  05/09/18               Expected Discharge Plan:  Home w Home Health Services  In-House Referral:  NA  Discharge planning Services  CM Consult  Post Acute Care Choice:  Home Health Choice offered to:  Patient  DME Arranged:  3-N-1, Walker rolling DME Agency:  [facility]  HH Arranged:  PT HH Agency[facility]  [facility]  Status of Service:  Completed, signed off  If discussed at [employer] Meetings, dates discussed:    Additional Comments:  Elliot CousinShavis, Sukhman Kocher Ellen, RN 05/09/2018, 1:01 PM

## 2018-05-09 NOTE — Discharge Instructions (Signed)

## 2018-05-09 NOTE — Progress Notes (Signed)
Physical Therapy Treatment Patient Details Name: Katelyn Copeland MRN: [id number] DOB: [birth year] Today's Date: 05/09/2018    History of Present Illness 62 yo female s/p L DA-THA on 05/08/18. PMH includes OA, anxiety, osteopenia, HLD, pre-DM.    PT Comments    POD # 1 am session Assisted OOB required increased time due to c/o nausea.  Pt reports poor eating/drinking.  Assisted withamb to bathroom required much effort.  General Gait Details: very unsteady gait amb to bathroom then assisted with toilet transfer.  Static standing at sink pt required assist for balance.  Assisted amb to recliner as pt was too fatigue to attempt hallway.    Follow Up Recommendations  Follow surgeon's recommendation for DC plan and follow-up therapies;Supervision for mobility/OOB(HH)     Equipment Recommendations  Rolling walker with 5" wheels;3in1 (PT)    Recommendations for Other Services       Precautions / Restrictions Precautions Precautions: Fall Precaution Comments: axiety Restrictions Weight Bearing Restrictions: No Other Position/Activity Restrictions: WBAT     Mobility  Bed Mobility Overal bed mobility: Needs Assistance Bed Mobility: Supine to Sit     Supine to sit: Min assist     General bed mobility comments: assist L LE and increased time to complete scooting to EOB  Transfers Overall transfer level: Needs assistance Equipment used: Rolling walker (2 wheeled) Transfers: Sit to/from [employer] Sit to Stand: Min assist;From elevated surface         General transfer comment: 50% VC's on proper hand placmenet and safety with turn completion.   Assisted off elevated bed as well as toilet transfer.  Impaired safety cognition with increased anxiety with activity.    Ambulation/Gait Ambulation/Gait assistance: Min assist Gait Distance (Feet): 18 Feet(9 feet x 2 to and from bathroom only ) Assistive device: Rolling walker (2 wheeled) Gait Pattern/deviations:  Step-to pattern;Step-through pattern;Drifts right/left Gait velocity: decreased    General Gait Details: very unsteady gait amb to bathroom then assisted with toilet transfer.  Static standing at sink pt required assist for balance.  Assisted amb to recliner as pt was too fatigue to attempt hallway.     Stairs             Wheelchair Mobility    Modified Rankin (Stroke Patients Only)       Balance                                            Cognition Arousal/Alertness: Awake/alert Behavior During Therapy: WFL for tasks assessed/performed Overall Cognitive Status: Within Functional Limits for tasks assessed                                 General Comments: anxiety with activity as well as worry       Exercises      General Comments        Pertinent Vitals/Pain Pain Assessment: 0-10 Pain Score: 8  Pain Location: L hip  Pain Descriptors / Indicators: Sore;Aching Pain Intervention(s): Monitored during session;Premedicated before session;Repositioned;Ice applied    Home Living Family/patient expects to be discharged to:: Private residence Living Arrangements: Alone Available Help at Discharge: Friend(s);Available PRN/intermittently Type of Home: House Home Access: Level entry   Home Layout: One level Home Equipment: Toilet riser      Prior Function Level of Independence:  Independent      Comments: Pt works fulltime job in [employer] system. 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    PT Goals (current goals can now be found in the care plan section) Acute Rehab PT Goals Patient Stated Goal: to feel better Progress towards PT goals: Progressing toward goals    Frequency    7X/week      PT Plan Current plan remains appropriate    Co-evaluation              AM-PAC PT "6 Clicks" Mobility   Outcome Measure  Help needed turning from your back to your side while in a flat bed  without using bedrails?: A Little Help needed moving from lying on your back to sitting on the side of a flat bed without using bedrails?: A Little Help needed moving to and from a bed to a chair (including a wheelchair)?: A Little Help needed standing up from a chair using your arms (e.g., wheelchair or bedside chair)?: A Little Help needed to walk in hospital room?: A Little Help needed climbing 3-5 steps with a railing? : A Little 6 Click Score: 18    End of Session Equipment Utilized During Treatment: Gait belt Activity Tolerance: Patient limited by fatigue(anxiety/nausea) Patient left: in chair;with call bell/phone within reach Nurse Communication: Mobility status PT Visit Diagnosis: Other abnormalities of gait and mobility (R26.89);Pain Pain - Right/Left: Left Pain - part of body: Hip     Time: 0855-0920 PT Time Calculation (min) (ACUTE ONLY): 25 min  Charges:  $Gait Training: 8-22 mins $Therapeutic Activity: 8-22 mins                     Felecia ShellingLori Christy Ehrsam  PTA Acute  Rehabilitation Services Pager      [phone] Office      [phone]

## 2018-05-09 NOTE — Progress Notes (Signed)
Subjective: 1 Day Post-Op Procedure(s) (LRB): LEFT TOTAL HIP ARTHROPLASTY ANTERIOR APPROACH (Left) Patient reports pain as moderate.  A lot of anxiety this am.  Vitals stable.  Spasms and nausea as well.  Also with acute blood loss anemia, but asymptomatic.  Objective: Vital signs in last 24 hours: Temp:  [97.3 F (36.3 C)-98.7 F (37.1 C)] 97.9 F (36.6 C) (12/14 0939) Pulse Rate:  [49-93] 93 (12/14 0939) Resp:  [10-17] 16 (12/14 0939) BP: (92-109)/(61-79) 94/61 (12/14 0939) SpO2:  [93 %-100 %] 93 % (12/14 0939)  Intake/Output from previous day: 12/13 0701 - 12/14 0700 In: 3018.4 [P.O.:300; I.V.:2618.4; IV Piggyback:100] Out: 1735 [Urine:1560; Blood:175] Intake/Output this shift: Total I/O In: 60 [P.O.:60] Out: 100 [Urine:100]  Recent Labs    05/06/18 1413 05/09/18 0336  HGB 11.7* 9.9*   Recent Labs    05/06/18 1413 05/09/18 0336  WBC 4.8 10.6*  RBC 4.38 3.63*  HCT 37.9 32.3*  PLT 252 194   Recent Labs    05/09/18 0336  NA 141  K 4.1  CL 107  CO2 23  BUN 16  CREATININE 0.97  GLUCOSE 134*  CALCIUM 8.4*   No results for input(s): LABPT, INR in the last 72 hours.  Sensation intact distally Intact pulses distally Dorsiflexion/Plantar flexion intact Incision: scant drainage  Assessment/Plan: 1 Day Post-Op Procedure(s) (LRB): LEFT TOTAL HIP ARTHROPLASTY ANTERIOR APPROACH (Left) Up with therapy Discharge home with home health Monday.    Katelyn Copeland 05/09/2018, 11:16 AM

## 2018-05-09 NOTE — Progress Notes (Signed)
Physical Therapy Treatment Patient Details Name: Katelyn Copeland MRN: [id number] DOB: 06/04/1955 Today's Date: 05/09/2018    History of Present Illness 62 yo female s/p L DA-THA on 05/08/18. PMH includes OA, anxiety, osteopenia, HLD, pre-DM.    PT Comments    POD # 1 pm session Assisted OOB to amb to bathroom again required increased time and much effort due to nausea and pain level.  Pt was able to amb in hallway after an extended seated rest break after amb to and from bathroom.  Very unsteady gait.  Mac c/o B UE weakness.  Increased c/o nausea.Assisted pt back to bed.    Follow Up Recommendations  Follow surgeon's recommendation for DC plan and follow-up therapies;Supervision for mobility/OOB(HH)     Equipment Recommendations  Rolling walker with 5" wheels;3in1 (PT)    Recommendations for Other Services       Precautions / Restrictions Precautions Precautions: Fall Precaution Comments: axiety Restrictions Weight Bearing Restrictions: No Other Position/Activity Restrictions: WBAT     Mobility  Bed Mobility Overal bed mobility: Needs Assistance Bed Mobility: Supine to Sit;Sit to Supine     Supine to sit: Min assist Sit to supine: Mod assist   General bed mobility comments: assist L LE and increased time to complete scooting to EOB then required increased assist to support B LE up onto bed due to increased pain  Transfers Overall transfer level: Needs assistance Equipment used: Rolling walker (2 wheeled) Transfers: Sit to/from [employer] Sit to Stand: Min assist;From elevated surface         General transfer comment: 50% VC's on proper hand placmenet and safety with turn completion.   Assisted off elevated bed as well as toilet transfer.  Impaired safety cognition with increased anxiety with activity.    Ambulation/Gait Ambulation/Gait assistance: Min assist Gait Distance (Feet): 45 Feet Assistive device: Rolling walker (2 wheeled) Gait  Pattern/deviations: Step-to pattern;Step-through pattern;Drifts right/left Gait velocity: decreased    General Gait Details: assisted with amb to bathroom 25% VC's on proper walker to self distance and 50% VC's safety with turns.  Limited activity tolerance due to pain, nausea and fatigue.  Pt was able to amb in hallway but required a seated rest break on EOB prior.     Stairs             Wheelchair Mobility    Modified Rankin (Stroke Patients Only)       Balance                                            Cognition Arousal/Alertness: Awake/alert Behavior During Therapy: WFL for tasks assessed/performed Overall Cognitive Status: Within Functional Limits for tasks assessed                                 General Comments: anxiety with activity as well as worry       Exercises      General Comments        Pertinent Vitals/Pain Pain Assessment: 0-10 Pain Score: 8  Pain Location: L hip  Pain Descriptors / Indicators: Sore;Aching Pain Intervention(s): Monitored during session;Premedicated before session;Repositioned;Ice applied    Home Living Family/patient expects to be discharged to:: Private residence Living Arrangements: Alone Available Help at Discharge: Friend(s);Available PRN/intermittently Type of Home: House Home Access: Level entry  Home Layout: One level Home Equipment: Toilet riser      Prior Function Level of Independence: Independent      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    PT Goals (current goals can now be found in the care plan section) Acute Rehab PT Goals Patient Stated Goal: to feel better Progress towards PT goals: Progressing toward goals    Frequency    7X/week      PT Plan Current plan remains appropriate    Co-evaluation              AM-PAC PT "6 Clicks" Mobility   Outcome Measure  Help  needed turning from your back to your side while in a flat bed without using bedrails?: A Little Help needed moving from lying on your back to sitting on the side of a flat bed without using bedrails?: A Little Help needed moving to and from a bed to a chair (including a wheelchair)?: A Little Help needed standing up from a chair using your arms (e.g., wheelchair or bedside chair)?: A Little Help needed to walk in hospital room?: A Little Help needed climbing 3-5 steps with a railing? : A Little 6 Click Score: 18    End of Session Equipment Utilized During Treatment: Gait belt Activity Tolerance: Patient limited by fatigue(anxiety/nausea) Patient left: in chair;with call bell/phone within reach Nurse Communication: Mobility status PT Visit Diagnosis: Other abnormalities of gait and mobility (R26.89);Pain Pain - Right/Left: Left Pain - part of body: Hip     Time: 1505-1530 PT Time Calculation (min) (ACUTE ONLY): 25 min  Charges:  $Gait Training: 8-22 mins $Therapeutic Activity: 8-22 mins                     Rica Koyanagi  PTA Acute  Rehabilitation Services Pager      [phone] Office      [phone]

## 2018-05-09 NOTE — Plan of Care (Signed)

## 2018-05-09 NOTE — Progress Notes (Signed)
Occupational Therapy Evaluation Patient Details Name: Katelyn Copeland MRN: [id number] DOB: Feb 11, 1956 Today's Date: 05/09/2018    History of Present Illness 62 yo female s/p L DA-THA on 05/08/18. PMH includes OA, anxiety, osteopenia, HLD, pre-DM.   Clinical Impression   All OT education completed and pt questions answered. No further OT needs identified. Will sign off.    Follow Up Recommendations  Supervision - Intermittent    Equipment Recommendations  3 in 1 bedside commode    Recommendations for Other Services PT consult     Precautions / Restrictions Precautions Precautions: Fall Restrictions Weight Bearing Restrictions: No Other Position/Activity Restrictions: WBAT       Mobility Bed Mobility                  Transfers                      Balance                                           ADL either performed or assessed with clinical judgement   ADL Overall ADL's : Needs assistance/impaired                                       General ADL Comments: Patient reports nausea; limited evaluation. States she has been up to the bathroom to toilet with nursing staff without difficulty. Reports son here to stay for a few days and can assist with LB self-care. Patient may sponge bathe initially. Verbal education on shower transfer provided. Patient reports no further OT needed.     Vision         Perception     Praxis      Pertinent Vitals/Pain Pain Assessment: 0-10 Pain Score: 4  Pain Location: L hip  Pain Descriptors / Indicators: Sore;Aching Pain Intervention(s): Limited activity within patient's tolerance     Hand Dominance Right   Extremity/Trunk Assessment Upper Extremity Assessment Upper Extremity Assessment: Overall WFL for tasks assessed   Lower Extremity Assessment Lower Extremity Assessment: Defer to PT evaluation       Communication Communication Communication: No difficulties    Cognition Arousal/Alertness: Awake/alert Behavior During Therapy: WFL for tasks assessed/performed Overall Cognitive Status: Within Functional Limits for tasks assessed                                     General Comments       Exercises     Shoulder Instructions      Home Living Family/patient expects to be discharged to:: Private residence Living Arrangements: Alone Available Help at Discharge: Friend(s);Available PRN/intermittently Type of Home: House Home Access: Level entry     Home Layout: One level     Bathroom Shower/Tub: Producer, television/film/video: Handicapped height     Home Equipment: Toilet riser          Prior Functioning/Environment Level of Independence: Independent        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         OT Problem List: Decreased strength;Decreased activity tolerance;Pain;Decreased knowledge of use  of DME or AE      OT Treatment/Interventions:      OT Goals(Current goals can be found in the care plan section) Acute Rehab OT Goals Patient Stated Goal: to feel better OT Goal Formulation: All assessment and education complete, DC therapy  OT Frequency:     Barriers to D/C:            Co-evaluation              AM-PAC OT "6 Clicks" Daily Activity     Outcome Measure Help from another person eating meals?: None Help from another person taking care of personal grooming?: None Help from another person toileting, which includes using toliet, bedpan, or urinal?: A Little Help from another person bathing (including washing, rinsing, drying)?: A Little Help from another person to put on and taking off regular upper body clothing?: None Help from another person to put on and taking off regular lower body clothing?: A Little 6 Click Score: 21   End of Session Nurse Communication: Mobility status  Activity  Tolerance: Other (comment)(limited by nausea) Patient left: in bed;with call bell/phone within reach;with family/visitor present  OT Visit Diagnosis: Pain Pain - Right/Left: Left Pain - part of body: Hip                Time: 1215-1225 OT Time Calculation (min): 10 min Charges:  OT General Charges $OT Visit: 1 Visit OT Evaluation $OT Eval Low Complexity: 1 Low    Davida Falconi A Shelitha Magley 05/09/2018, 2:39 PM

## 2018-05-10 MED ORDER — CYCLOBENZAPRINE HCL 5 MG PO TABS
5.0000 mg | ORAL_TABLET | Freq: Three times a day (TID) | ORAL | Status: DC | PRN
  Administered 2018-05-10 – 2018-05-11 (×4): 5 mg via ORAL
  Filled 2018-05-10 (×4): qty 1

## 2018-05-10 NOTE — Progress Notes (Signed)
Physical Therapy Treatment Patient Details Name: Katelyn Copeland MRN: [id number] DOB: 1956/01/05 Today's Date: 05/10/2018    History of Present Illness 62 yo female s/p L DA-THA on 05/08/18. PMH includes OA, anxiety, osteopenia, HLD, pre-DM.    PT Comments     continues to progress. Plans Dc tomorrow. Still with nausea.   Follow Up Recommendations  Follow surgeon's recommendation for DC plan and follow-up therapies;Supervision for mobility/OOB     Equipment Recommendations  Rolling walker with 5" wheels;3in1 (PT)    Recommendations for Other Services       Precautions / Restrictions Precautions Precautions: Fall Precaution Comments: anxiety Restrictions Other Position/Activity Restrictions: WBAT     Mobility  Bed Mobility Overal bed mobility: Needs Assistance Bed Mobility: Supine to Sit;Sit to Supine     Supine to sit: Min guard Sit to supine: Min assist   General bed mobility comments: assist with LLE  Transfers Overall transfer level: Needs assistance Equipment used: Rolling walker (2 wheeled) Transfers: Sit to/from Stand Sit to Stand: From elevated surface;Supervision Stand pivot transfers: Supervision       General transfer comment: patient stood from BSc  Ambulation/Gait Ambulation/Gait assistance: Min guard Gait Distance (Feet): 20 Feet(x 2) Assistive device: Rolling walker (2 wheeled) Gait Pattern/deviations: Step-to pattern;Step-through pattern Gait velocity: decreased    General Gait Details: cues for sequence and  posture   Stairs             Wheelchair Mobility    Modified Rankin (Stroke Patients Only)       Balance                                            Cognition Arousal/Alertness: Awake/alert                                            Exercises    General Comments        Pertinent Vitals/Pain Pain Score: 7  Pain Location: L hip  Pain Descriptors / Indicators:  Sore;Aching;Constant Pain Intervention(s): Monitored during session;Ice applied;Repositioned    Home Living                      Prior Function            PT Goals (current goals can now be found in the care plan section) Progress towards PT goals: Progressing toward goals    Frequency    7X/week      PT Plan Current plan remains appropriate    Co-evaluation              AM-PAC PT "6 Clicks" Mobility   Outcome Measure  Help needed turning from your back to your side while in a flat bed without using bedrails?: A Little Help needed moving from lying on your back to sitting on the side of a flat bed without using bedrails?: A Little Help needed moving to and from a bed to a chair (including a wheelchair)?: A Little Help needed standing up from a chair using your arms (e.g., wheelchair or bedside chair)?: A Little Help needed to walk in hospital room?: A Little Help needed climbing 3-5 steps with a railing? : A Lot 6 Click Score: 17    End of Session  Equipment Utilized During Treatment: Gait belt Activity Tolerance: Patient tolerated treatment well Patient left: in bed;with call bell/phone within reach;with bed alarm set;with family/visitor present Nurse Communication: Mobility status PT Visit Diagnosis: Other abnormalities of gait and mobility (R26.89);Pain Pain - Right/Left: Left Pain - part of body: Hip     Time: 1445-1501 PT Time Calculation (min) (ACUTE ONLY): 16 min  Charges:  $Gait Training: 8-22 mins Blanchard Kelch PT Acute Rehabilitation Services Pager [phone] Office [phone]                       Rada Hay 05/10/2018, 3:32 PM

## 2018-05-10 NOTE — Progress Notes (Signed)
Subjective: 2 Days Post-Op Procedure(s) (LRB): LEFT TOTAL HIP ARTHROPLASTY ANTERIOR APPROACH (Left) Patient reports pain as moderate.    Objective: Vital signs in last 24 hours: Temp:  [98.7 F (37.1 C)-99.1 F (37.3 C)] 99.1 F (37.3 C) (12/15 0536) Pulse Rate:  [84-92] 86 (12/15 0536) Resp:  [15-16] 16 (12/15 0536) BP: (96-99)/(50-68) 99/68 (12/15 0536) SpO2:  [95 %-98 %] 95 % (12/15 0536)  Intake/Output from previous day: 12/14 0701 - 12/15 0700 In: 851.2 [P.O.:540; I.V.:311.2] Out: 900 [Urine:900] Intake/Output this shift: Total I/O In: 240 [P.O.:240] Out: -   Recent Labs    05/09/18 0336  HGB 9.9*   Recent Labs    05/09/18 0336  WBC 10.6*  RBC 3.63*  HCT 32.3*  PLT 194   Recent Labs    05/09/18 0336  NA 141  K 4.1  CL 107  CO2 23  BUN 16  CREATININE 0.97  GLUCOSE 134*  CALCIUM 8.4*   No results for input(s): LABPT, INR in the last 72 hours.  Neurologically intact No cellulitis present Compartment soft    Assessment/Plan: 2 Days Post-Op Procedure(s) (LRB): LEFT TOTAL HIP ARTHROPLASTY ANTERIOR APPROACH (Left) Up with therapy- needs another day of PT, no calf pain or SOB. Dressing left hip clean and dry    Katelyn Copeland Worlds 05/10/2018, 10:26 AM

## 2018-05-10 NOTE — Plan of Care (Signed)
  Problem: Education: Goal: Knowledge of General Education information will improve Description Including pain rating scale, medication(s)/side effects and non-pharmacologic comfort measures Outcome: Progressing   Problem: Health Behavior/Discharge Planning: Goal: Ability to manage health-related needs will improve Outcome: Progressing   Problem: Clinical Measurements: Goal: Ability to maintain clinical measurements within normal limits will improve Outcome: Progressing Goal: Respiratory complications will improve Outcome: Progressing   Problem: Activity: Goal: Risk for activity intolerance will decrease Outcome: Progressing   Problem: Nutrition: Goal: Adequate nutrition will be maintained Outcome: Progressing   Problem: Elimination: Goal: Will not experience complications related to bowel motility Outcome: Progressing

## 2018-05-10 NOTE — Progress Notes (Signed)
Physical Therapy Treatment Patient Details Name: Katelyn Copeland MRN: [id number] DOB: [birth year] Today's Date: 05/10/2018    History of Present Illness 62 yo female s/p L DA-THA on 05/08/18. PMH includes OA, anxiety, osteopenia, HLD, pre-DM.    PT Comments    The patient has been nauseated today. Progressing with mobility. Plans Dc tomorrow.  Follow Up Recommendations  Follow surgeon's recommendation for DC plan and follow-up therapies;Supervision for mobility/OOB     Equipment Recommendations  Rolling walker with 5" wheels;3in1 (PT)    Recommendations for Other Services       Precautions / Restrictions Precautions Precautions: Fall Precaution Comments: anxiety Restrictions Other Position/Activity Restrictions: WBAT     Mobility  Bed Mobility Overal bed mobility: Needs Assistance Bed Mobility: Supine to Sit;Sit to Supine     Supine to sit: Min assist     General bed mobility comments: assist L LE and increased time to complete scooting to EOB, using belt to self assist  Transfers Overall transfer level: Needs assistance Equipment used: Rolling walker (2 wheeled) Transfers: Sit to/from Stand Sit to Stand: From elevated surface;Supervision Stand pivot transfers: Supervision       General transfer comment: patient stood from BSc  Ambulation/Gait Ambulation/Gait assistance: Min assist Gait Distance (Feet): 100 Feet Assistive device: Rolling walker (2 wheeled) Gait Pattern/deviations: Step-to pattern;Step-through pattern Gait velocity: decreased    General Gait Details: cues for sequence and  posture   Stairs             Wheelchair Mobility    Modified Rankin (Stroke Patients Only)       Balance                                            Cognition Arousal/Alertness: Awake/alert                                            Exercises Total Joint Exercises Ankle Circles/Pumps: AROM;10 reps;Both Quad  Sets: AROM;10 reps;Both Short Arc Quad: AROM;Left;10 reps Heel Slides: AAROM;Left;10 reps Hip ABduction/ADduction: AAROM;Left    General Comments        Pertinent Vitals/Pain Pain Score: 4  Pain Location: L hip  Pain Descriptors / Indicators: Sore;Aching Pain Intervention(s): Monitored during session;Premedicated before session    Home Living                      Prior Function            PT Goals (current goals can now be found in the care plan section) Progress towards PT goals: Progressing toward goals    Frequency    7X/week      PT Plan Current plan remains appropriate    Co-evaluation              AM-PAC PT "6 Clicks" Mobility   Outcome Measure  Help needed turning from your back to your side while in a flat bed without using bedrails?: A Little Help needed moving from lying on your back to sitting on the side of a flat bed without using bedrails?: A Little Help needed moving to and from a bed to a chair (including a wheelchair)?: A Little Help needed standing up from a chair using your arms (e.g., wheelchair or  bedside chair)?: A Little Help needed to walk in hospital room?: A Little Help needed climbing 3-5 steps with a railing? : A Lot 6 Click Score: 17    End of Session Equipment Utilized During Treatment: Gait belt Activity Tolerance: Patient tolerated treatment well Patient left: in chair;with call bell/phone within reach;with family/visitor present Nurse Communication: Mobility status PT Visit Diagnosis: Other abnormalities of gait and mobility (R26.89);Pain Pain - Right/Left: Left Pain - part of body: Hip     Time: 1610-9604 PT Time Calculation (min) (ACUTE ONLY): 26 min  Charges:  $Gait Training: 8-22 mins $Therapeutic Exercise: 8-22 mins                     Blanchard Kelch PT Acute Rehabilitation Services Pager [phone] Office [phone]    Rada Hay 05/10/2018, 3:30 PM

## 2018-05-11 ENCOUNTER — Encounter (HOSPITAL_COMMUNITY): Payer: Self-pay | Admitting: Orthopaedic Surgery

## 2018-05-11 MED ORDER — OXYCODONE HCL 5 MG PO TABS: 5.0000 mg | ORAL_TABLET | ORAL | 0 refills | Status: DC | PRN

## 2018-05-11 MED ORDER — TIZANIDINE HCL 4 MG PO TABS: 4.0000 mg | ORAL_TABLET | Freq: Three times a day (TID) | ORAL | 0 refills | Status: DC | PRN

## 2018-05-11 MED ORDER — ASPIRIN 81 MG PO CHEW: 81.0000 mg | CHEWABLE_TABLET | Freq: Two times a day (BID) | ORAL | 0 refills | Status: DC

## 2018-05-11 NOTE — Progress Notes (Signed)
Physical Therapy Treatment Patient Details Name: Katelyn PalingCynthia C Copeland MRN: [id number] DOB: 1956-04-11 Today's Date: 05/11/2018    History of Present Illness 62 yo female s/p L DA-THA on 05/08/18. PMH includes OA, anxiety, osteopenia, HLD, pre-DM.    PT Comments    POD # 3 Pt dressed and eager to go home.  Feeling better.  Assisted with amb a functional distance, practiced one curb step and Performed some TE's following HEP handout.  Instructed on proper tech, freq as well as use of ICE.     Follow Up Recommendations  Follow surgeon's recommendation for DC plan and follow-up therapies;Supervision for mobility/OOB     Equipment Recommendations  Rolling walker with 5" wheels;3in1 (PT)    Recommendations for Other Services       Precautions / Restrictions Precautions Precautions: Fall Restrictions Weight Bearing Restrictions: No Other Position/Activity Restrictions: WBAT     Mobility  Bed Mobility               General bed mobility comments: OOB in recliner   Transfers Overall transfer level: Needs assistance Equipment used: Rolling walker (2 wheeled) Transfers: Sit to/from Stand Sit to Stand: Supervision Stand pivot transfers: Supervision       General transfer comment: pt more able to self perform   Ambulation/Gait Ambulation/Gait assistance: Supervision Gait Distance (Feet): 85 Feet Assistive device: Rolling walker (2 wheeled) Gait Pattern/deviations: Step-to pattern;Step-through pattern Gait velocity: decreased    General Gait Details: tolerated an increased distance    Stairs Stairs: Yes Stairs assistance: Supervision Stair Management: No rails;Step to pattern;Forwards Number of Stairs: 1 General stair comments: 25% VC's on proper tech   Wheelchair Mobility    Modified Rankin (Stroke Patients Only)       Balance                                            Cognition Arousal/Alertness: Awake/alert Behavior During  Therapy: WFL for tasks assessed/performed Overall Cognitive Status: Within Functional Limits for tasks assessed                                 General Comments: feeling better       Exercises   Total Hip Replacement TE's 10 reps ankle pumps 10 reps knee presses 10 reps heel slides 10 reps SAQ's 10 reps ABD Followed by ICE     General Comments        Pertinent Vitals/Pain Pain Assessment: 0-10 Pain Score: 3  Pain Location: L hip  Pain Descriptors / Indicators: Operative site guarding;Discomfort Pain Intervention(s): Repositioned    Home Living                      Prior Function            PT Goals (current goals can now be found in the care plan section) Progress towards PT goals: Progressing toward goals    Frequency    7X/week      PT Plan Current plan remains appropriate    Co-evaluation              AM-PAC PT "6 Clicks" Mobility   Outcome Measure  Help needed turning from your back to your side while in a flat bed without using bedrails?: A Little Help needed moving from lying on  your back to sitting on the side of a flat bed without using bedrails?: A Little Help needed moving to and from a bed to a chair (including a wheelchair)?: A Little Help needed standing up from a chair using your arms (e.g., wheelchair or bedside chair)?: A Little Help needed to walk in hospital room?: A Little Help needed climbing 3-5 steps with a railing? : A Little 6 Click Score: 18    End of Session Equipment Utilized During Treatment: Gait belt Activity Tolerance: Patient tolerated treatment well Patient left: in chair;with call bell/phone within reach Nurse Communication: Mobility status(pt ready for D/C to home ) PT Visit Diagnosis: Other abnormalities of gait and mobility (R26.89);Pain Pain - Right/Left: Left Pain - part of body: Hip     Time: 1000-1025 PT Time Calculation (min) (ACUTE ONLY): 25 min  Charges:  $Gait Training:  8-22 mins $Therapeutic Exercise: 8-22 mins                     Felecia Shelling  PTA Acute  Rehabilitation Services Pager      [phone] Office      [phone]

## 2018-05-11 NOTE — Progress Notes (Signed)
Patient ID: Katelyn Copeland, female   DOB: 08-Jul-1955, 62 y.o.   MRN: [id number] Making improvements overall.  Vitals stable.  Left hip stable.  Does have intermittent spasms.  Can be discharged to home today.

## 2018-05-11 NOTE — Discharge Summary (Signed)
Patient ID: Katelyn Copeland MRN: [id number] DOB/AGE: 05/29/1955 62 y.o.  Admit date: 05/08/2018 Discharge date: 05/11/2018  Admission Diagnoses:  Principal Problem:   Unilateral primary osteoarthritis, left hip Active Problems:   Status post total replacement of left hip   Discharge Diagnoses:  Same  Past Medical History:  Diagnosis Date  . Anxiety   . Arthritis   . Complication of anesthesia   . H/O cardiovascular stress test 2011   normal myo stsiudy  . H/O Doppler ultrasound 2003   normal study  . H/O echocardiogram 2011   normal study  . H/O osteopenia   . H/O: hematuria   . Hyperlipidemia   . PONV (postoperative nausea and vomiting)   . Pre-diabetes   . Shingles   . Vaginal prolapse    h/o    Surgeries: Procedure(s): LEFT TOTAL HIP ARTHROPLASTY ANTERIOR APPROACH on 05/08/2018   Consultants:   Discharged Condition: Improved  Hospital Course: Katelyn PalingCynthia C Baiz is an 62 y.o. female who was admitted 05/08/2018 for operative treatment ofUnilateral primary osteoarthritis, left hip. Patient has severe unremitting pain that affects sleep, daily activities, and work/hobbies. After pre-op clearance the patient was taken to the operating room on 05/08/2018 and underwent  Procedure(s): LEFT TOTAL HIP ARTHROPLASTY ANTERIOR APPROACH.    Patient was given perioperative antibiotics:  Anti-infectives (From admission, onward)   Start     Dose/Rate Route Frequency Ordered Stop   05/08/18 1800  ceFAZolin (ANCEF) IVPB 1 g/50 mL premix     1 g 100 mL/hr over 30 Minutes Intravenous Every 6 hours 05/08/18 1515 05/09/18 0012   05/08/18 0915  ceFAZolin (ANCEF) IVPB 2g/100 mL premix     2 g 200 mL/hr over 30 Minutes Intravenous On call to O.R. 05/08/18 0913 05/08/18 1156       Patient was given sequential compression devices, early ambulation, and chemoprophylaxis to prevent DVT.  Patient benefited maximally from hospital stay and there were no complications.    Recent vital  signs:  Patient Vitals for the past 24 hrs:  BP Temp Temp src Pulse Resp SpO2  05/11/18 0529 103/63 99.2 F (37.3 C) Oral 84 14 91 %  05/10/18 2228 108/61 98.9 F (37.2 C) Oral 81 16 91 %  05/10/18 1348 110/74 99.8 F (37.7 C) Oral 79 16 97 %     Recent laboratory studies:  Recent Labs    05/09/18 0336  WBC 10.6*  HGB 9.9*  HCT 32.3*  PLT 194  NA 141  K 4.1  CL 107  CO2 23  BUN 16  CREATININE 0.97  GLUCOSE 134*  CALCIUM 8.4*     Discharge Medications:   Allergies as of 05/11/2018      Reactions   Erythromycin Nausea And Vomiting      Medication List    TAKE these medications   aspirin 81 MG chewable tablet Chew 1 tablet (81 mg total) by mouth 2 (two) times daily.   BIOFREEZE EX Apply 1 application topically daily as needed (hip pain).   Biotin 1000 MCG tablet Take 1,000 mcg by mouth daily.   BLUE-EMU SUPER STRENGTH EX Apply 1 application topically daily as needed (hip pain).   cetirizine 10 MG tablet Commonly known as:  ZYRTEC Take 10 mg by mouth at bedtime.   cholecalciferol 25 MCG (1000 UT) tablet Commonly known as:  VITAMIN D3 Take 1,000 Units by mouth daily.   fish oil-omega-3 fatty acids 1000 MG capsule Take 2 g by mouth daily.   fluticasone  50 MCG/ACT nasal spray Commonly known as:  FLONASE Place 2 sprays into both nostrils daily as needed for allergies.   LORazepam 1 MG tablet Commonly known as:  ATIVAN Take 0.5 mg by mouth every 8 (eight) hours as needed for anxiety.   oxyCODONE 5 MG immediate release tablet Commonly known as:  Oxy IR/ROXICODONE Take 1-2 tablets (5-10 mg total) by mouth every 4 (four) hours as needed for moderate pain (pain score 4-6).   tiZANidine 4 MG tablet Commonly known as:  ZANAFLEX Take 1 tablet (4 mg total) by mouth every 8 (eight) hours as needed for muscle spasms.            Durable Medical Equipment  (From admission, onward)         Start     Ordered   05/08/18 1516  DME Walker rolling  Once     Question:  Patient needs a walker to treat with the following condition  Answer:  Status post total replacement of left hip   05/08/18 1515   05/08/18 1516  DME 3 n 1  Once     05/08/18 1515          Diagnostic Studies: Dg Pelvis Portable  Result Date: 05/08/2018 CLINICAL DATA:  Postop left hip replacement. EXAM: PORTABLE PELVIS 1-2 VIEWS COMPARISON:  11/20/2017 FINDINGS: Interval placement of left total hip arthroplasty which is intact and normally located. Remainder of the exam is unchanged. IMPRESSION: Expected changes post left total hip arthroplasty. Electronically Signed   By: Elberta Fortis M.D.   On: 05/08/2018 13:48   Dg C-arm 1-60 Min-no Report  Result Date: 05/08/2018 Fluoroscopy was utilized by the requesting physician.  No radiographic interpretation.   Dg Hip Operative Unilat With Pelvis Left  Result Date: 05/08/2018 CLINICAL DATA:  LEFT total hip replacement EXAM: OPERATIVE LEFT HIP (WITH PELVIS IF PERFORMED) 5 VIEWS TECHNIQUE: Fluoroscopic spot image(s) were submitted for interpretation post-operatively. COMPARISON:  11/20/2017 FLUOROSCOPY TIME:  0 minutes 28 seconds Dose: 3.69 mGy FINDINGS: Images demonstrate osseous demineralization. Resection of proximal LEFT femur with placement of a LEFT hip prosthesis. No fracture, dislocation or bone destruction identified. IMPRESSION: LEFT hip prosthesis without acute complication. Electronically Signed   By: Ulyses Southward M.D.   On: 05/08/2018 13:03    Disposition: Discharge disposition: 01-Home or Self Care         Follow-up Information    [facility] Follow up.   Specialty:  Home Health Services Why:  Home Health Physical Therapy-agency will call to arrange appointment Contact information: [address] [place] [address] [place] [address] [phone]        Kathryne Hitch, MD Follow up in 2 week(s).   Specialty:  Orthopedic Surgery Contact information: [address] [place]  [address] [phone]            Signed: Kathryne Hitch 05/11/2018, 7:25 AM

## 2018-05-12 ENCOUNTER — Telehealth (INDEPENDENT_AMBULATORY_CARE_PROVIDER_SITE_OTHER): Payer: Self-pay | Admitting: Orthopaedic Surgery

## 2018-05-12 NOTE — Telephone Encounter (Signed)
Verbal order left on VM  

## 2018-05-12 NOTE — Telephone Encounter (Signed)
Received voicemail message from Keane Policehristopher [facility] stating he did eval for HH[facility]  And need verbal orders for HHPT 2 Wk 1,  1 Wk 1 and 2 Wk 1. The number to contact Cristal DeerChristopher is [phone]

## 2018-05-25 ENCOUNTER — Encounter (INDEPENDENT_AMBULATORY_CARE_PROVIDER_SITE_OTHER): Payer: Self-pay | Admitting: Orthopaedic Surgery

## 2018-05-25 ENCOUNTER — Ambulatory Visit (INDEPENDENT_AMBULATORY_CARE_PROVIDER_SITE_OTHER): Payer: BC Managed Care – PPO | Admitting: Orthopaedic Surgery

## 2018-05-25 DIAGNOSIS — Z96642 Presence of left artificial hip joint: Secondary | ICD-10-CM

## 2018-05-25 NOTE — Progress Notes (Signed)
The patient is 2 weeks status post a left total hip arthroplasty.  She is doing well.  She is already off of pain medication.  She has been taking aspirin twice a day.  She is walking without assistive device.  Home therapy is been helping with her mobility.  She is only 62 years old.  On exam her left hip incision looks good.  I placed new Steri-Strips.  There is no significant seroma.  At this point she will get on aspirin just once a day for a week and then can stop her aspirin.  All question concerns were answered and addressed.  I gave her a note to return to work starting January 20.  We will see her back in 4 weeks to see how she is doing overall but no x-rays are needed.

## 2018-06-01 ENCOUNTER — Other Ambulatory Visit (HOSPITAL_COMMUNITY): Payer: Self-pay | Admitting: Orthopaedic Surgery

## 2018-06-01 NOTE — Telephone Encounter (Signed)
Ok for refill? 

## 2018-06-11 ENCOUNTER — Encounter (INDEPENDENT_AMBULATORY_CARE_PROVIDER_SITE_OTHER): Payer: Self-pay | Admitting: Orthopaedic Surgery

## 2018-06-11 ENCOUNTER — Ambulatory Visit (INDEPENDENT_AMBULATORY_CARE_PROVIDER_SITE_OTHER): Payer: BC Managed Care – PPO | Admitting: Orthopaedic Surgery

## 2018-06-11 DIAGNOSIS — Z96642 Presence of left artificial hip joint: Secondary | ICD-10-CM

## 2018-06-11 NOTE — Progress Notes (Signed)
The patient is now 34 days status post a left total hip arthroplasty.  She is walking without assistive device and looks good overall but she states the hip is still very painful to her.  I can easily put her through range of motion without difficulty involving her left hip.  Her incisions healed over nicely.  She does also report difficulty sleeping.  Her leg lengths are equal.  This point should continue increase her activities and I gave her reassurance that this is normal to still have these kind of pains.  I have recommended her taking Aleve twice a day.  We will keep her out of work until February 3 which she can return at that standpoint.  I will see her back in a month to see how she is doing overall but no x-rays are needed.

## 2018-06-15 ENCOUNTER — Other Ambulatory Visit (INDEPENDENT_AMBULATORY_CARE_PROVIDER_SITE_OTHER): Payer: Self-pay

## 2018-06-15 ENCOUNTER — Telehealth (INDEPENDENT_AMBULATORY_CARE_PROVIDER_SITE_OTHER): Payer: Self-pay | Admitting: Orthopaedic Surgery

## 2018-06-15 MED ORDER — AMOXICILLIN 500 MG PO TABS: ORAL_TABLET | ORAL | 0 refills | Status: AC

## 2018-06-15 NOTE — Telephone Encounter (Signed)
Patient aware this was called in for her  

## 2018-06-15 NOTE — Telephone Encounter (Signed)
Patient called stating that she has a dentist appointment today at 12:30 and is a post op from December 2019.  She is needing an antibiotic called into her pharmacy.  [facility] [address]  She also wants to know if she can take it an hour before the appointment and will be okay to still have her dentist appointment.  CB#[phone].  Thank you.

## 2018-07-14 ENCOUNTER — Ambulatory Visit (INDEPENDENT_AMBULATORY_CARE_PROVIDER_SITE_OTHER): Payer: BC Managed Care – PPO | Admitting: Orthopaedic Surgery

## 2018-07-14 ENCOUNTER — Encounter (INDEPENDENT_AMBULATORY_CARE_PROVIDER_SITE_OTHER): Payer: Self-pay | Admitting: Orthopaedic Surgery

## 2018-07-14 DIAGNOSIS — Z96642 Presence of left artificial hip joint: Secondary | ICD-10-CM

## 2018-07-14 NOTE — Progress Notes (Signed)
The patient is just over 8 weeks now status post a left total hip arthroplasty.  She says she is doing much better overall and is made good progress over the last few weeks.  She still reports left hip numbness but does state the incision is healed at this point.  She is walking without assistive device.  Both hips move smoothly without any issues at all and her leg lengths are equal.  She does have numbness around her incision which is to be expected.  This point she will slowly increase her activities as comfort allows.  They are essentially no restrictions for her other than the running.  I would like to see her back in 6 months with a standing low AP pelvis and lateral of her left operative hip.  All question concerns were answered and addressed.  If there is any issues before then she will let us know.

## 2019-01-13 ENCOUNTER — Ambulatory Visit (INDEPENDENT_AMBULATORY_CARE_PROVIDER_SITE_OTHER): Payer: BC Managed Care – PPO

## 2019-01-13 ENCOUNTER — Encounter: Payer: Self-pay | Admitting: Orthopaedic Surgery

## 2019-01-13 ENCOUNTER — Ambulatory Visit (INDEPENDENT_AMBULATORY_CARE_PROVIDER_SITE_OTHER): Payer: BC Managed Care – PPO | Admitting: Orthopaedic Surgery

## 2019-01-13 DIAGNOSIS — Z96642 Presence of left artificial hip joint: Secondary | ICD-10-CM | POA: Diagnosis not present

## 2019-01-13 NOTE — Progress Notes (Signed)
Office Visit Note   Patient: Katelyn PalingCynthia C Sevcik           Date of Birth: [birth year]           MRN: [id number] Visit Date: 01/13/2019              Requested by: Laurann MontanaWhite, Lavren, MD [phone] [place],  [place] [id number] PCP: Laurann MontanaWhite, Georgiana, MD   Assessment & Plan: Visit Diagnoses:  1. Status post left hip replacement     Plan: At this point she is doing so well the follow-up can be as needed.  If she has any issues at all as it relates to that hip she will let us know.  All question concerns were answered addressed and we counseled her about the things will need to bring her back for the hip or any other orthopedic issues.  Follow-Up Instructions: Return if symptoms worsen or fail to improve.   Orders:  Orders Placed This Encounter  Procedures  . XR HIP UNILAT W OR W/O PELVIS 1V LEFT   No orders of the defined types were placed in this encounter.     Procedures: No procedures performed   Clinical Data: No additional findings.   Subjective: Chief Complaint  Patient presents with  . Left Hip - Follow-up  The patient is 8 months status post a left total hip arthroplasty.  She says is doing wonderful.  She has great motion and good strength.  She denies any issues at all with it.  HPI  Review of Systems She denies any fever, chills, nausea, vomiting  Objective: Vital Signs: There were no vitals taken for this visit.  Physical Exam She is alert and orient x3 and in no acute distress Ortho Exam Examination of her left hip shows full range of motion with no pain at all.  Her right hip moves normally.  Her leg lengths are equal. Specialty Comments:  No specialty comments available.  Imaging: Xr Hip Unilat W Or W/o Pelvis 1v Left  Result Date: 01/13/2019 An AP pelvis and lateral left hip shows a well-seated total hip arthroplasty with no complicating features.  There is no significant arthritis at all in the right hip.    PMFS History: Patient  Active Problem List   Diagnosis Date Noted  . Unilateral primary osteoarthritis, left hip 05/08/2018  . 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  . Urinary frequency 09/23/2011  . Vaginal discharge 09/23/2011  . Incontinent of urine 09/23/2011  . 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 Anxiety   . Arthritis   . Complication of anesthesia   . H/O cardiovascular stress test 2011   normal myo stsiudy  . H/O Doppler ultrasound 2003   normal study  . H/O echocardiogram 2011   normal study  . H/O osteopenia   . H/O: hematuria   . Hyperlipidemia   . PONV (postoperative nausea and vomiting)   . Pre-diabetes   . Shingles   . Vaginal prolapse    h/o    Family History  Problem Relation Age of Onset  . Heart disease Paternal Grandmother   . Heart disease Maternal Grandfather   . Heart disease Father   . Heart disease Mother     Past Surgical History:  Procedure Laterality Date  . ABDOMINAL HYSTERECTOMY    . COLONOSCOPY    . JOINT REPLACEMENT     Left hip Allie BossierChris Mahealani Sulak 05-08-18  . TOTAL  HIP ARTHROPLASTY Left 05/08/2018   Procedure: LEFT TOTAL HIP ARTHROPLASTY ANTERIOR APPROACH;  Surgeon: Mcarthur Rossetti, MD;  Location: [facility];  Service: Orthopedics;  Laterality: Left;  . tubes tied     Social History   Occupational History  . Not on file  Tobacco Use  . Smoking status: Former Smoker    Types: Cigarettes  . Smokeless tobacco: Never Used  . Tobacco comment: quit when 64 -30 years old  Substance and Sexual Activity  . Alcohol use: No    Comment: rare  . Drug use: No  . Sexual activity: Yes    Partners: Male    Birth control/protection: Other-see comments, Post-menopausal    Comment: pt has had hysterectomy

## 2019-07-02 ENCOUNTER — Encounter: Payer: Self-pay | Admitting: Cardiology

## 2019-07-02 ENCOUNTER — Other Ambulatory Visit: Payer: Self-pay

## 2019-07-02 ENCOUNTER — Ambulatory Visit: Payer: BC Managed Care – PPO | Admitting: Cardiology

## 2019-07-02 VITALS — BP 106/70 | HR 60 | Ht 67.5 in | Wt 163.6 lb

## 2019-07-02 DIAGNOSIS — Z01812 Encounter for preprocedural laboratory examination: Secondary | ICD-10-CM

## 2019-07-02 DIAGNOSIS — R079 Chest pain, unspecified: Secondary | ICD-10-CM

## 2019-07-02 DIAGNOSIS — Z8249 Family history of ischemic heart disease and other diseases of the circulatory system: Secondary | ICD-10-CM

## 2019-07-02 MED ORDER — METOPROLOL TARTRATE 100 MG PO TABS: 100.0000 mg | ORAL_TABLET | ORAL | 0 refills | Status: DC

## 2019-07-02 NOTE — Progress Notes (Signed)
Cardiology Office Note:    Date:  07/02/2019   ID:  Katelyn, Copeland 1955-10-03, MRN [id number]  PCP:  Katelyn Montana, MD  Cardiologist:  Katelyn Schultz, MD  Electrophysiologist:  None   Referring MD: Katelyn Montana, MD     History of Present Illness:    Katelyn Copeland is a 64 y.o. female here for the evaluation of chest pain at the request of Dr. Cliffton Asters.  Has been treated recently for possible GERD with omeprazole, PPI but still is having occasional chest pressure pain.  For instance in the evening she will work out on her treadmill and does not feel quite right.  Sometimes have some less chest and middle of the chest sensation.  This can also help while she is typing during the day at work.  Increased stress during Covid working at home.  She sometimes has left neck shooting discomfort.  While on the treadmill her heart rate feels faster, unusual, mild pressure.  Last summer however she was able to cut the grass without any difficulty.  She received her first Covid vaccine.  In college she smoked only.  Her father died at age 80 from congestive heart failure.  She is not have diabetes but she has been diagnosed with borderline diabetes with hemoglobin A1c of 6.5.  Her total cholesterol in the past has been 200 LDL 130 and she has been offered statin before.  Currently not taking.  Hemoglobin 9.9 in 2019 creatinine 0.9 in 2019 ALT 21.  On May 09, 2019 she did undergo hip replacement without difficulty.  Mother - pacer, Father - CHF. 2 brothers ok, not close.    Past Medical History:  Diagnosis Date  . Anxiety   . Arthritis   . Complication of anesthesia   . H/O cardiovascular stress test 2011   normal myo stsiudy  . H/O Doppler ultrasound 2003   normal study  . H/O echocardiogram 2011   normal study  . H/O osteopenia   . H/O: hematuria   . Hyperlipidemia   . Incontinent of urine 09/23/2011  . Monilial vaginitis 09/23/2011  . PONV (postoperative nausea and  vomiting)   . Pre-diabetes   . Shingles   . 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  . Unilateral primary osteoarthritis, left hip 05/08/2018  . Urinary frequency 09/23/2011  . Vaginal discharge 09/23/2011  . Vaginal prolapse    h/o    Past Surgical History:  Procedure Laterality Date  . ABDOMINAL HYSTERECTOMY    . COLONOSCOPY    . JOINT REPLACEMENT     Left hip Katelyn Copeland 05-08-18  . TOTAL HIP ARTHROPLASTY Left 05/08/2018   Procedure: LEFT TOTAL HIP ARTHROPLASTY ANTERIOR APPROACH;  Surgeon: Kathryne Hitch, MD;  Location: [facility];  Service: Orthopedics;  Laterality: Left;  . tubes tied      Current Medications: Current Meds  Medication Sig  . amoxicillin (AMOXIL) 500 MG tablet Take 2 tabs by mouth one hour before dental appointment, then 2 by mouth six hours after  . Biotin 1000 MCG tablet Take 1,000 mcg by mouth daily.  . cetirizine (ZYRTEC) 10 MG tablet Take 10 mg by mouth at bedtime.   . cholecalciferol (VITAMIN D3) 25 MCG (1000 UT) tablet Take 1,000 Units by mouth daily.  . fish oil-omega-3 fatty acids 1000 MG capsule Take 2 g by mouth daily.  . fluticasone (FLONASE) 50 MCG/ACT nasal spray Place 2 sprays into both nostrils daily as needed for allergies.   Marland Kitchen  LORazepam (ATIVAN) 1 MG tablet Take 0.5 mg by mouth every 8 (eight) hours as needed for anxiety.      Allergies:   Erythromycin   Social History   Socioeconomic History  . Marital status: Single    Spouse name: Not on file  . Number of children: Not on file  . Years of education: Not on file  . Highest education level: Not on file  Occupational History  . Not on file  Tobacco Use  . Smoking status: Former Smoker    Types: Cigarettes  . Smokeless tobacco: Never Used  . Tobacco comment: quit when 57 -48 years old  Substance and Sexual Activity  . Alcohol use: No    Comment: rare  . Drug use: No  . Sexual activity: Yes    Partners: Male    Birth control/protection: Other-see  comments, Post-menopausal    Comment: pt has had hysterectomy  Other Topics Concern  . Not on file  Social History Narrative  . Not on file   Social Determinants of Health   Financial Resource Strain:   . Difficulty of Paying Living Expenses: Not on file  Food Insecurity:   . Worried About Programme researcher, broadcasting/film/video in the Last Year: Not on file  . Ran Out of Food in the Last Year: Not on file  Transportation Needs:   . Lack of Transportation (Medical): Not on file  . Lack of Transportation (Non-Medical): Not on file  Physical Activity:   . Days of Exercise per Week: Not on file  . Minutes of Exercise per Session: Not on file  Stress:   . Feeling of Stress : Not on file  Social Connections:   . Frequency of Communication with Friends and Family: Not on file  . Frequency of Social Gatherings with Friends and Family: Not on file  . Attends Religious Services: Not on file  . Active Member of Clubs or Organizations: Not on file  . Attends Banker Meetings: Not on file  . Marital Status: Not on file     Family History: The patient's family history includes Heart disease in her father, maternal grandfather, mother, and paternal grandmother.  ROS:   Please see the history of present illness.    No bleeding no syncope no fevers no chills all other systems reviewed and are negative.  EKGs/Labs/Other Studies Reviewed:    The following studies were reviewed today:  Echocardiogram 2011 showed mild mitral vegetation mild to moderate tricuspid regurgitation normal EF.  Nuclear stress test 2016 was low risk with no ischemia  EKG:  EKG is ordered today.  The ekg ordered today demonstrates sinus rhythm 60 no other significant abnormalities.  Recent Labs: No results found for requested labs within last 8760 hours.  Recent Lipid Panel    Component Value Date/Time   CHOL 195 02/28/2015 0830   TRIG 108 02/28/2015 0830   HDL 52 02/28/2015 0830   CHOLHDL 3.8 02/28/2015 0830    VLDL 22 02/28/2015 0830   LDLCALC 121 02/28/2015 0830    Physical Exam:    VS:  BP 106/70   Pulse 60   Ht 5' 7.5" (1.715 m)   Wt 163 lb 9.6 oz (74.2 kg)   BMI 25.25 kg/m     Wt Readings from Last 3 Encounters:  07/02/19 163 lb 9.6 oz (74.2 kg)  05/08/18 173 lb (78.5 kg)  05/06/18 173 lb (78.5 kg)     GEN:  Well nourished, well developed in no  acute distress HEENT: Normal NECK: No JVD; No carotid bruits LYMPHATICS: No lymphadenopathy CARDIAC: RRR, soft systolic murmur, no rubs, gallops RESPIRATORY:  Clear to auscultation without rales, wheezing or rhonchi  ABDOMEN: Soft, non-tender, non-distended MUSCULOSKELETAL:  No edema; No deformity  SKIN: Warm and dry NEUROLOGIC:  Alert and oriented x 3 PSYCHIATRIC:  Normal affect   ASSESSMENT:    1. Chest pain, unspecified type   2. Pre-procedure lab exam   3. Family history of early CAD    PLAN:    In order of problems listed above:  Chest discomfort -Given her family history, we will go ahead and proceed with coronary CT scan with possible FFR analysis.  Resting heart rate with EKG 60, give metoprolol according to algorithm.  This will be helpful in guiding [place] as well on statin use.  If she has coronary plaque calcified or noncalcified we should probably initiate atorvastatin now. -We will also check an echocardiogram to ensure proper structure and function of her heart.  Prior mild mitral regurgitation and tricuspid regurgitation. -Shooting left neck pain likely musculoskeletal.  She was concerned because prior elderly family members received carotid endarterectomy.  I do not hear any bruits.  Hyperlipidemia -Prior total cholesterol 200.  May need statin therapy if evidence of plaque is seen on CT scan.  We will review with her.  We will follow-up with results of testing.   Medication Adjustments/Labs and Tests Ordered: Current medicines are reviewed at length with the patient today.  Concerns regarding medicines are  outlined above.  Orders Placed This Encounter  Procedures  . CT CORONARY MORPH W/CTA COR W/SCORE W/CA W/CM &/OR WO/CM  . CT CORONARY FRACTIONAL FLOW RESERVE DATA PREP  . CT CORONARY FRACTIONAL FLOW RESERVE FLUID ANALYSIS  . Basic metabolic panel  . EKG 12-Lead  . ECHOCARDIOGRAM COMPLETE   Meds ordered this encounter  Medications  . metoprolol tartrate (LOPRESSOR) 100 MG tablet    Sig: Take 1 tablet (100 mg total) by mouth as directed.    Dispense:  1 tablet    Refill:  0    Patient Instructions  Medication Instructions:  NO CHANGES *If you need a refill on your cardiac medications before your next appointment, please call your pharmacy*  Lab Work: BMET PRIOR TO CARDIAC CT If you have labs (blood work) drawn today and your tests are completely normal, you will receive your results only by: Marland Kitchen MyChart Message (if you have MyChart) OR . A paper copy in the mail If you have any lab test that is abnormal or we need to change your treatment, we will call you to review the results.  Testing/Procedures Your physician has requested that you have an echocardiogram. Echocardiography is a painless test that uses sound waves to create images of your heart. It provides your doctor with information about the size and shape of your heart and how well your heart's chambers and valves are working. This procedure takes approximately one hour. There are no restrictions for this procedure.  CARDIAC CT    Follow-Up: At [facility], you and your health needs are our priority.  As part of our continuing mission to provide you with exceptional heart care, we have created designated Provider Care Teams.  These Care Teams include your primary Cardiologist (physician) and Advanced Practice Providers (APPs -  Physician Assistants and Nurse Practitioners) who all work together to provide you with the care you need, when you need it.  Your next appointment:   AS NEEDED  The format for your next  appointment:   AS NEEDED    Your cardiac CT will be scheduled at one of the below locations:   [facility] [address] [place], [place] [address] [phone]  OR If scheduled at [facility], please arrive at the [facility] main entrance of [facility] 30 minutes prior to test start time. Proceed to the [facility] Radiology Department (first floor) to check-in and test prep.   Please follow these instructions carefully (unless otherwise directed):  On the Night Before the Test: . Be sure to Drink plenty of water. . Do not consume any caffeinated/decaffeinated beverages or chocolate 12 hours prior to your test. . Do not take any antihistamines 12 hours prior to your test. . I  On the Day of the Test: . Drink plenty of water. Do not drink any water within one hour of the test. . Do not eat any food 4 hours prior to the test. . You may take your regular medications prior to the test.  . Take metoprolol (Lopressor) two hours prior to test.   *For Clinical Staff only. Please instruct patient the following:*        -Drink plenty of water       -      -Take metoprolol (Lopressor) 2 hours prior to test (if applicable).                  -If HR is less than 55 BPM- No Beta Blocker                -IF HR is greater than 55 BPM and patient is less than or equal to 56 yrs old Lopressor 100mg  x1.                -If HR is greater than 55 BPM and patient is greater than 77 yrs old Lopressor 50 mg x1.           After the Test: . Drink plenty of water. . After receiving IV contrast, you may experience a mild flushed feeling. This is normal. . On occasion, you may experience a mild rash up to 24 hours after the test. This is not dangerous. If this occurs, you can take Benadryl 25 mg and increase your fluid intake. . If you experience trouble breathing, this can be serious. If it is severe call 911 IMMEDIATELY. If it is mild, please call our office. . If you  take any of these medications: Glipizide/Metformin, Avandament, Glucavance, please do not take 48 hours after completing test unless otherwise instructed.   Once we have confirmed authorization from your insurance company, we will call you to set up a date and time for your test.   For non-scheduling related questions, please contact the cardiac imaging nurse navigator should you have any questions/concerns: 61, RN Navigator Cardiac Imaging [facility] [phone] mobile      Signed, [phone], MD  07/02/2019 10:26 AM    [facility]

## 2019-07-02 NOTE — Patient Instructions (Addendum)
Medication Instructions:  NO CHANGES *If you need a refill on your cardiac medications before your next appointment, please call your pharmacy*  Lab Work: BMET PRIOR TO CARDIAC CT If you have labs (blood work) drawn today and your tests are completely normal, you will receive your results only by: Marland Kitchen MyChart Message (if you have MyChart) OR . A paper copy in the mail If you have any lab test that is abnormal or we need to change your treatment, we will call you to review the results.  Testing/Procedures Your physician has requested that you have an echocardiogram. Echocardiography is a painless test that uses sound waves to create images of your heart. It provides your doctor with information about the size and shape of your heart and how well your heart's chambers and valves are working. This procedure takes approximately one hour. There are no restrictions for this procedure.  CARDIAC CT    Follow-Up: At [facility], you and your health needs are our priority.  As part of our continuing mission to provide you with exceptional heart care, we have created designated Provider Care Teams.  These Care Teams include your primary Cardiologist (physician) and Advanced Practice Providers (APPs -  Physician Assistants and Nurse Practitioners) who all work together to provide you with the care you need, when you need it.  Your next appointment:   AS NEEDED  The format for your next appointment:   AS NEEDED    Your cardiac CT will be scheduled at one of the below locations:   [facility] [address] [place], [place] [address] [phone]  OR If scheduled at [facility], please arrive at the [facility] main entrance of [facility] 30 minutes prior to test start time. Proceed to the [facility] Radiology Department (first floor) to check-in and test prep.   Please follow these instructions carefully (unless otherwise directed):  On the Night Before the  Test: . Be sure to Drink plenty of water. . Do not consume any caffeinated/decaffeinated beverages or chocolate 12 hours prior to your test. . Do not take any antihistamines 12 hours prior to your test. . I  On the Day of the Test: . Drink plenty of water. Do not drink any water within one hour of the test. . Do not eat any food 4 hours prior to the test. . You may take your regular medications prior to the test.  . Take metoprolol (Lopressor) two hours prior to test.   *For Clinical Staff only. Please instruct patient the following:*        -Drink plenty of water       -      -Take metoprolol (Lopressor) 2 hours prior to test (if applicable).                  -If HR is less than 55 BPM- No Beta Blocker                -IF HR is greater than 55 BPM and patient is less than or equal to 33 yrs old Lopressor 100mg  x1.                -If HR is greater than 55 BPM and patient is greater than 50 yrs old Lopressor 50 mg x1.           After the Test: . Drink plenty of water. . After receiving IV contrast, you may experience a mild flushed feeling.  This is normal. . On occasion, you may experience a mild rash up to 24 hours after the test. This is not dangerous. If this occurs, you can take Benadryl 25 mg and increase your fluid intake. . If you experience trouble breathing, this can be serious. If it is severe call 911 IMMEDIATELY. If it is mild, please call our office. . If you take any of these medications: Glipizide/Metformin, Avandament, Glucavance, please do not take 48 hours after completing test unless otherwise instructed.   Once we have confirmed authorization from your insurance company, we will call you to set up a date and time for your test.   For non-scheduling related questions, please contact the cardiac imaging nurse navigator should you have any questions/concerns: [facility], RN Navigator Cardiac Imaging [facility] [phone] mobile

## 2019-07-16 ENCOUNTER — Other Ambulatory Visit (HOSPITAL_COMMUNITY): Payer: BC Managed Care – PPO

## 2019-07-16 ENCOUNTER — Other Ambulatory Visit: Payer: BC Managed Care – PPO

## 2019-07-28 ENCOUNTER — Other Ambulatory Visit: Payer: BC Managed Care – PPO | Admitting: *Deleted

## 2019-07-28 ENCOUNTER — Other Ambulatory Visit: Payer: Self-pay

## 2019-07-28 ENCOUNTER — Ambulatory Visit (HOSPITAL_COMMUNITY): Payer: BC Managed Care – PPO | Attending: Cardiovascular Disease

## 2019-07-28 DIAGNOSIS — R079 Chest pain, unspecified: Secondary | ICD-10-CM

## 2019-07-28 DIAGNOSIS — Z01812 Encounter for preprocedural laboratory examination: Secondary | ICD-10-CM

## 2019-07-28 LAB — BASIC METABOLIC PANEL
BUN/Creatinine Ratio: 16 (ref 12–28)
BUN: 14 mg/dL (ref 8–27)
CO2: 25 mmol/L (ref 20–29)
Calcium: 9.1 mg/dL (ref 8.7–10.3)
Chloride: 103 mmol/L (ref 96–106)
Creatinine, Ser: 0.89 mg/dL (ref 0.57–1.00)
GFR calc Af Amer: 79 mL/min/{1.73_m2} (ref >59)
GFR calc non Af Amer: 68 mL/min/{1.73_m2} (ref >59)
Glucose: 80 mg/dL (ref 65–99)
Potassium: 4.4 mmol/L (ref 3.5–5.2)
Sodium: 141 mmol/L (ref 134–144)

## 2019-07-29 ENCOUNTER — Encounter: Payer: Self-pay | Admitting: *Deleted

## 2019-08-09 ENCOUNTER — Telehealth (HOSPITAL_COMMUNITY): Payer: Self-pay | Admitting: Emergency Medicine

## 2019-08-09 NOTE — Telephone Encounter (Signed)
Reaching out to patient to offer assistance regarding upcoming cardiac imaging study; pt verbalizes understanding of appt date/time, parking situation and where to check in, pre-test NPO status and medications ordered, and verified current allergies; name and call back number provided for further questions should they arise [facility] RN Navigator Cardiac Imaging [facility] [phone] office [phone] cell  Pt clarifying what time to take PO metoprolol if HR > 55 bpm. I instructed her 2 hr prior to appt. Also informed her not to check in til 7:30-7:45.  [place] Dec

## 2019-08-10 ENCOUNTER — Ambulatory Visit (HOSPITAL_COMMUNITY)
Admission: RE | Admit: 2019-08-10 | Discharge: 2019-08-10 | Disposition: A | Discharge status: Home / Self Care | Payer: BC Managed Care – PPO | Source: Ambulatory Visit | Attending: Cardiology | Admitting: Cardiology

## 2019-08-10 ENCOUNTER — Other Ambulatory Visit: Payer: Self-pay

## 2019-08-10 ENCOUNTER — Encounter: Payer: BC Managed Care – PPO | Admitting: *Deleted

## 2019-08-10 DIAGNOSIS — Z006 Encounter for examination for normal comparison and control in clinical research program: Secondary | ICD-10-CM

## 2019-08-10 DIAGNOSIS — R079 Chest pain, unspecified: Secondary | ICD-10-CM | POA: Diagnosis not present

## 2019-08-10 DIAGNOSIS — I700 Atherosclerosis of aorta: Secondary | ICD-10-CM | POA: Diagnosis not present

## 2019-08-10 MED ORDER — NITROGLYCERIN 0.4 MG SL SUBL: 0.8000 mg | SUBLINGUAL_TABLET | Freq: Once | SUBLINGUAL | Status: DC

## 2019-08-10 MED ORDER — NITROGLYCERIN 0.4 MG SL SUBL
SUBLINGUAL_TABLET | SUBLINGUAL | Status: AC
  Filled 2019-08-10: qty 2

## 2019-08-10 MED ORDER — IOHEXOL 350 MG/ML SOLN
80.0000 mL | Freq: Once | INTRAVENOUS | Status: AC | PRN
  Administered 2019-08-10: 80 mL via INTRAVENOUS

## 2019-08-10 NOTE — Research (Signed)
CADFEM Informed Consent   Subject Name: Katelyn Copeland  Subject met inclusion and exclusion criteria.  The informed consent form, study requirements and expectations were reviewed with the subject and questions and concerns were addressed prior to the signing of the consent form.  The subject verbalized understanding of the trial requirements.  The subject agreed to participate in the CADFEM trial and signed the informed consent at 0659 on 08/10/2019.  The informed consent was obtained prior to performance of any protocol-specific procedures for the subject.  A copy of the signed informed consent was given to the subject and a copy was placed in the subject's medical record.   Oletta Cohn.

## 2019-08-11 DIAGNOSIS — R079 Chest pain, unspecified: Secondary | ICD-10-CM | POA: Diagnosis not present

## 2019-08-11 DIAGNOSIS — I700 Atherosclerosis of aorta: Secondary | ICD-10-CM | POA: Diagnosis not present

## 2019-08-13 ENCOUNTER — Telehealth: Payer: Self-pay | Admitting: *Deleted

## 2019-08-13 DIAGNOSIS — I2510 Atherosclerotic heart disease of native coronary artery without angina pectoris: Secondary | ICD-10-CM

## 2019-08-13 DIAGNOSIS — E7849 Other hyperlipidemia: Secondary | ICD-10-CM

## 2019-08-13 MED ORDER — ROSUVASTATIN CALCIUM 20 MG PO TABS: 20.0000 mg | ORAL_TABLET | Freq: Every day | ORAL | 11 refills | Status: DC

## 2019-08-13 NOTE — Telephone Encounter (Signed)
I spoke with patient and reviewed results with her. Will send prescription to [facility]. She will come in for fasting lab work on June 14,2021.

## 2019-08-13 NOTE — Telephone Encounter (Signed)
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  Would now recommend starting Crestor 20mg  PO QD to reduce heart attack risk and to help stabilize plaque.   Repeat lipid panel and ALT in 3 months.   , MD

## 2019-10-08 ENCOUNTER — Other Ambulatory Visit: Payer: Self-pay | Admitting: Otolaryngology

## 2019-10-08 ENCOUNTER — Ambulatory Visit
Admission: RE | Admit: 2019-10-08 | Discharge: 2019-10-08 | Disposition: A | Discharge status: Home / Self Care | Payer: BC Managed Care – PPO | Source: Ambulatory Visit | Attending: Otolaryngology | Admitting: Otolaryngology

## 2019-10-08 ENCOUNTER — Other Ambulatory Visit: Payer: Self-pay

## 2019-10-08 DIAGNOSIS — R0781 Pleurodynia: Secondary | ICD-10-CM

## 2019-11-08 ENCOUNTER — Other Ambulatory Visit: Payer: Self-pay

## 2019-11-08 ENCOUNTER — Other Ambulatory Visit: Payer: BC Managed Care – PPO | Admitting: *Deleted

## 2019-11-08 DIAGNOSIS — E7849 Other hyperlipidemia: Secondary | ICD-10-CM

## 2019-11-08 DIAGNOSIS — I2510 Atherosclerotic heart disease of native coronary artery without angina pectoris: Secondary | ICD-10-CM

## 2019-11-08 LAB — LIPID PANEL
Chol/HDL Ratio: 3.3 ratio (ref 0.0–4.4)
Cholesterol, Total: 160 mg/dL (ref 100–199)
HDL: 49 mg/dL (ref >39)
LDL Chol Calc (NIH): 98 mg/dL (ref 0–99)
Triglycerides: 63 mg/dL (ref 0–149)
VLDL Cholesterol Cal: 13 mg/dL (ref 5–40)

## 2019-11-08 LAB — ALT: ALT: 16 IU/L (ref 0–32)

## 2020-08-15 ENCOUNTER — Encounter: Payer: Self-pay | Admitting: Orthopaedic Surgery

## 2020-08-15 ENCOUNTER — Ambulatory Visit: Payer: Self-pay

## 2020-08-15 ENCOUNTER — Ambulatory Visit (INDEPENDENT_AMBULATORY_CARE_PROVIDER_SITE_OTHER): Payer: Medicare Other | Admitting: Orthopaedic Surgery

## 2020-08-15 VITALS — Ht 67.0 in | Wt 160.0 lb

## 2020-08-15 DIAGNOSIS — M25571 Pain in right ankle and joints of right foot: Secondary | ICD-10-CM

## 2020-08-15 NOTE — Progress Notes (Signed)
Office Visit Note   Patient: Katelyn Copeland           Date of Birth: 1956/02/07           MRN: [id number] Visit Date: 08/15/2020              Requested by: Katelyn Montana, MD [phone] Katelyn Copeland [address] [place],  [place] [address] PCP: Katelyn Montana, MD   Assessment & Plan: Visit Diagnoses:  1. Pain in right ankle and joints of right foot     Plan: Impression is right ankle pain and arthritis.  Overall she is feeling better and based on her options she would like to just continue with Advil and relative rest and max freeze.  She will return if she would like to consider other options such as a cam boot or cortisone injection or stronger anti-inflammatories.  Follow-Up Instructions: Return if symptoms worsen or fail to improve.   Orders:  Orders Placed This Encounter  Procedures  . XR Ankle Complete Right   No orders of the defined types were placed in this encounter.     Procedures: No procedures performed   Clinical Data: No additional findings.   Subjective: Chief Complaint  Patient presents with  . Right Ankle - Pain    Katelyn Copeland is a very pleasant 65 year old female who comes in for evaluation of right ankle pain for about 6 days.  Reports no definite injuries or trauma.  She has pain when she is twisting her ankle mainly in eversion and dorsiflexion manner.  Denies any numbness and tingling.  Advil and Max freeze do help with the pain and overall she feels like there is improvement.  She is wearing normal shoes currently.   Review of Systems  Constitutional: Negative.   HENT: Negative.   Eyes: Negative.   Respiratory: Negative.   Cardiovascular: Negative.   Endocrine: Negative.   Musculoskeletal: Negative.   Neurological: Negative.   Hematological: Negative.   Psychiatric/Behavioral: Negative.   All other systems reviewed and are negative.    Objective: Vital Signs: Ht 5\' 7"  (1.702 m)   Wt 160 lb (72.6 kg)   BMI 25.06 kg/m   Physical  Exam Vitals and nursing note reviewed.  Constitutional:      Appearance: She is well-developed.  Pulmonary:     Effort: Pulmonary effort is normal.  Skin:    General: Skin is warm.     Capillary Refill: Capillary refill takes less than 2 seconds.  Neurological:     Mental Status: She is alert and oriented to person, place, and time.  Psychiatric:        Behavior: Behavior normal.        Thought Content: Thought content normal.        Judgment: Judgment normal.     Ortho Exam Right ankle shows no effusion.  No crepitus.  No tenderness along the peroneal tendons.  Full range of motion with good strength.  She has reproducible pain with ankle dorsiflexion and eversion.  Tendons are stable. Specialty Comments:  No specialty comments available.  Imaging: XR Ankle Complete Right  Result Date: 08/15/2020 Negative for acute findings.    PMFS History: Patient Active Problem List   Diagnosis Date Noted  . Unilateral primary osteoarthritis, left hip 05/08/2018  . 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  . Urinary frequency 09/23/2011  . Vaginal discharge 09/23/2011  . Incontinent of urine 09/23/2011  . 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 Anxiety   . Arthritis   . Complication of anesthesia   . H/O cardiovascular stress test 2011   normal myo stsiudy  . H/O Doppler ultrasound 2003   normal study  . H/O echocardiogram 2011   normal study  . H/O osteopenia   . H/O: hematuria   . Hyperlipidemia   . Incontinent of urine 09/23/2011  . Monilial vaginitis 09/23/2011  . PONV (postoperative nausea and vomiting)   . Pre-diabetes   . Shingles   . 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  . Unilateral primary osteoarthritis, left hip 05/08/2018  . Urinary frequency 09/23/2011  . Vaginal discharge 09/23/2011  . Vaginal prolapse    h/o    Family History  Problem Relation Age of Onset  . Heart disease Paternal  Grandmother   . Heart disease Maternal Grandfather   . Heart disease Father   . Heart disease Mother     Past Surgical History:  Procedure Laterality Date  . ABDOMINAL HYSTERECTOMY    . COLONOSCOPY    . JOINT REPLACEMENT     Left hip Katelyn Copeland 05-08-18  . TOTAL HIP ARTHROPLASTY Left 05/08/2018   Procedure: LEFT TOTAL HIP ARTHROPLASTY ANTERIOR APPROACH;  Surgeon: Katelyn Hitch, MD;  Location: [facility];  Service: Orthopedics;  Laterality: Left;  . tubes tied     Social History   Occupational History  . Not on file  Tobacco Use  . Smoking status: Former Smoker    Types: Cigarettes  . Smokeless tobacco: Never Used  . Tobacco comment: quit when 48 -78 years old  Vaping Use  . Vaping Use: Never used  Substance and Sexual Activity  . Alcohol use: No    Comment: rare  . Drug use: No  . Sexual activity: Yes    Partners: Male    Birth control/protection: Other-see comments, Post-menopausal    Comment: pt has had hysterectomy

## 2020-10-09 ENCOUNTER — Other Ambulatory Visit: Payer: Self-pay | Admitting: Cardiology

## 2020-12-09 ENCOUNTER — Encounter (HOSPITAL_BASED_OUTPATIENT_CLINIC_OR_DEPARTMENT_OTHER): Payer: Self-pay | Admitting: Emergency Medicine

## 2020-12-09 ENCOUNTER — Emergency Department (HOSPITAL_BASED_OUTPATIENT_CLINIC_OR_DEPARTMENT_OTHER)
Admission: EM | Admit: 2020-12-09 | Discharge: 2020-12-09 | Disposition: A | Discharge status: Home / Self Care | Payer: Medicare Other | Attending: Emergency Medicine | Admitting: Emergency Medicine

## 2020-12-09 DIAGNOSIS — Z96642 Presence of left artificial hip joint: Secondary | ICD-10-CM | POA: Insufficient documentation

## 2020-12-09 DIAGNOSIS — L249 Irritant contact dermatitis, unspecified cause: Secondary | ICD-10-CM

## 2020-12-09 DIAGNOSIS — R21 Rash and other nonspecific skin eruption: Secondary | ICD-10-CM | POA: Diagnosis present

## 2020-12-09 DIAGNOSIS — Z87891 Personal history of nicotine dependence: Secondary | ICD-10-CM | POA: Insufficient documentation

## 2020-12-09 MED ORDER — TRIAMCINOLONE ACETONIDE 0.1 % EX CREA: 1.0000 "application " | TOPICAL_CREAM | Freq: Two times a day (BID) | CUTANEOUS | 0 refills | Status: DC

## 2020-12-09 NOTE — Discharge Instructions (Addendum)
See your doctor in 3 days for recheck, sometimes these rashes can last for up to 10 days but the itching should get better with the cream.  If you are starting to have severe pain or spreading redness onto your back you should return immediately to the doctor's office or the emergency department.

## 2020-12-09 NOTE — ED Provider Notes (Signed)
[facility] EMERGENCY DEPT Provider Note   CSN: [id number] Arrival date & time: 12/09/20  1105     History Chief Complaint  Patient presents with   Rash    Katelyn Copeland is a 65 y.o. female.   Rash Associated symptoms: no fever    This patient is a very pleasant 66 year old female presenting with a rash that seems to be on the right side of her body as well as the right forearm.  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  She has no fevers or chills, she has been in the garden, the rash is itchy and she has a Band-Aid on the right forearm where it is the most itchy.  Past Medical History:  Diagnosis Date   Anxiety    Arthritis    Complication of anesthesia    H/O cardiovascular stress test 2011   normal myo stsiudy   H/O Doppler ultrasound 2003   normal study   H/O echocardiogram 2011   normal study   H/O osteopenia    H/O: hematuria    Hyperlipidemia    Incontinent of urine 09/23/2011   Monilial vaginitis 09/23/2011   PONV (postoperative nausea and vomiting)    Pre-diabetes    Shingles    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   Unilateral primary osteoarthritis, left hip 05/08/2018   Urinary frequency 09/23/2011   Vaginal discharge 09/23/2011   Vaginal prolapse    h/o    Patient Active Problem List   Diagnosis Date Noted   Unilateral primary osteoarthritis, left hip 05/08/2018   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   Urinary frequency 09/23/2011   Vaginal discharge 09/23/2011   Incontinent of urine 09/23/2011   Monilial vaginitis 09/23/2011    Past Surgical History:  Procedure Laterality Date   ABDOMINAL HYSTERECTOMY     COLONOSCOPY     JOINT REPLACEMENT     Left hip [name] 05-08-18   TOTAL HIP ARTHROPLASTY Left 05/08/2018   Procedure: LEFT TOTAL HIP ARTHROPLASTY ANTERIOR  APPROACH;  Surgeon: Kathryne Hitch, MD;  Location: [facility];  Service: Orthopedics;  Laterality: Left;   tubes tied       OB History     Gravida  2   Para  2   Term  2   Preterm      AB      Living  2      SAB      IAB      Ectopic      Multiple      Live Births              Family History  Problem Relation Age of Onset   Heart disease Paternal Grandmother    Heart disease Maternal Grandfather    Heart disease Father    Heart disease Mother     Social History   Tobacco Use   Smoking status: Former    Types: Cigarettes    Passive exposure: Past   Smokeless tobacco: Never   Tobacco comments:    quit when 80 -65 years old  Vaping Use   Vaping Use: Never used  Substance Use Topics   Alcohol use: No    Comment: rare   Drug use: No    Home Medications Prior to Admission medications   Medication Sig Start Date End Date  Taking? Authorizing Provider  amoxicillin (AMOXIL) 500 MG tablet Take 2 tabs by mouth one hour before dental appointment, then 2 by mouth six hours after 06/15/18   Kathryne Hitch, MD  Biotin 1000 MCG tablet Take 1,000 mcg by mouth daily.    [provider]  cetirizine (ZYRTEC) 10 MG tablet Take 10 mg by mouth at bedtime.     [provider]  cholecalciferol (VITAMIN D3) 25 MCG (1000 UT) tablet Take 1,000 Units by mouth daily.    [provider]  fish oil-omega-3 fatty acids 1000 MG capsule Take 2 g by mouth daily.    [provider]  fluticasone (FLONASE) 50 MCG/ACT nasal spray Place 2 sprays into both nostrils daily as needed for allergies.  02/08/15   [provider]  LORazepam (ATIVAN) 1 MG tablet Take 0.5 mg by mouth every 8 (eight) hours as needed for anxiety.     [provider]  metoprolol tartrate (LOPRESSOR) 100 MG tablet Take 1 tablet (100 mg total) by mouth as directed. 07/02/19   Jake Bathe, MD  rosuvastatin (CRESTOR) 20 MG tablet TAKE 1 TABLET BY MOUTH  EVERY DAY 10/09/20   Jake Bathe, MD    Allergies    Erythromycin  Review of Systems   Review of Systems  Constitutional:  Negative for fever.  Skin:  Positive for rash.   Physical Exam Updated Vital Signs BP 117/80 (BP Location: Left Arm)   Temp 97.9 F (36.6 C) (Oral)   Resp 20   SpO2 100%   Physical Exam Vitals and nursing note reviewed.  Constitutional:      Appearance: She is well-developed. She is not diaphoretic.  HENT:     Head: Normocephalic and atraumatic.  Eyes:     General:        Right eye: No discharge.        Left eye: No discharge.     Conjunctiva/sclera: Conjunctivae normal.  Pulmonary:     Effort: Pulmonary effort is normal. No respiratory distress.  Skin:    General: Skin is warm and dry.     Findings: Rash present. No erythema.     Comments: There is an erythematous papular rash which is scattered on the right side starting from just below the right inframammary area down towards the pelvis, there is nothing on the flank, she has a right volar forearm rash which appears to have a linear vesicular nature to it which appears pruritic  Neurological:     Mental Status: She is alert.     Coordination: Coordination normal.    ED Results / Procedures / Treatments   Labs (all labs ordered are listed, but only abnormal results are displayed) Labs Reviewed - No data to display  EKG None  Radiology No results found.  Procedures Procedures   Medications Ordered in ED Medications - No data to display  ED Course  I have reviewed the triage vital signs and the nursing notes.  Pertinent labs & imaging results that were available during my care of the patient were reviewed by me and considered in my medical decision making (see chart for details).    MDM Rules/Calculators/A&P                          Well-appearing, likely has a contact dermatitis to the right forearm, the rash on her abdomen is nonspecific but not consistent with shingles.  She  has normal vital signs,  will start on triamcinolone, the patient can follow-up closely.  Stable for discharge  Final Clinical Impression(s) / ED Diagnoses Final diagnoses:  None    Rx / DC Orders ED Discharge Orders     None        Eber Hong, MD 12/09/20 1144

## 2020-12-09 NOTE — ED Triage Notes (Signed)
Pt reports with rash to right side/abd. Pt reports pain with rash. Pt has had chicken pox . Had shingles on her forehead few years ago. Pt had herpes flare up recently (rash to periarea) and was treated.

## 2021-06-14 ENCOUNTER — Other Ambulatory Visit: Payer: Self-pay

## 2021-06-14 ENCOUNTER — Ambulatory Visit: Payer: Medicare PPO | Admitting: Sports Medicine

## 2021-06-14 DIAGNOSIS — L84 Corns and callosities: Secondary | ICD-10-CM | POA: Diagnosis not present

## 2021-06-14 DIAGNOSIS — B351 Tinea unguium: Secondary | ICD-10-CM

## 2021-06-14 DIAGNOSIS — M21622 Bunionette of left foot: Secondary | ICD-10-CM

## 2021-06-14 DIAGNOSIS — M79671 Pain in right foot: Secondary | ICD-10-CM

## 2021-06-14 DIAGNOSIS — M216X9 Other acquired deformities of unspecified foot: Secondary | ICD-10-CM | POA: Diagnosis not present

## 2021-06-14 DIAGNOSIS — M79672 Pain in left foot: Secondary | ICD-10-CM

## 2021-06-14 DIAGNOSIS — M21621 Bunionette of right foot: Secondary | ICD-10-CM

## 2021-06-14 NOTE — Progress Notes (Signed)
Subjective: Katelyn Copeland is a 66 y.o. female patient who presents to office for evaluation of Left>Right foot pain secondary to callus skin. Patient complains of pain at the lesion present for the last several months states that she is very active since retiring and states that she retired about a year and a half ago and has noticed that she is getting hard skin building up that is very sore and painful on the bottoms of both feet.  Patient also admits that she is concerned about the discoloration at both her big toenails previously in the past had fungal cultures and was given oral medication for that seem to never work.  Patient denies any other pedal complaints at this time.  Patient Active Problem List   Diagnosis Date Noted   Unilateral primary osteoarthritis, left hip 05/08/2018   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   Urinary frequency 09/23/2011   Vaginal discharge 09/23/2011   Incontinent of urine 09/23/2011   Monilial vaginitis 09/23/2011    Current Outpatient Medications on File Prior to Visit  Medication Sig Dispense Refill   amoxicillin (AMOXIL) 500 MG tablet Take 2 tabs by mouth one hour before dental appointment, then 2 by mouth six hours after 8 tablet 0   Biotin 1000 MCG tablet Take 1,000 mcg by mouth daily.     cetirizine (ZYRTEC) 10 MG tablet Take 10 mg by mouth at bedtime.      cholecalciferol (VITAMIN D3) 25 MCG (1000 UT) tablet Take 1,000 Units by mouth daily.     fish oil-omega-3 fatty acids 1000 MG capsule Take 2 g by mouth daily.     fluticasone (FLONASE) 50 MCG/ACT nasal spray Place 2 sprays into both nostrils daily as needed for allergies.   12   LORazepam (ATIVAN) 1 MG tablet Take 0.5 mg by mouth every 8 (eight) hours as needed for anxiety.      metoprolol tartrate (LOPRESSOR) 100 MG tablet Take 1 tablet (100 mg total) by mouth as directed. 1 tablet 0   rosuvastatin (CRESTOR) 20 MG tablet TAKE 1 TABLET BY MOUTH EVERY DAY 30 tablet 11    triamcinolone cream (KENALOG) 0.1 % Apply 1 application topically 2 (two) times daily. 30 g 0   No current facility-administered medications on file prior to visit.    Allergies  Allergen Reactions   Erythromycin Nausea And Vomiting    Objective:  General: Alert and oriented x3 in no acute distress  Dermatology: Keratotic lesion present submet 5 bilateral with skin lines transversing the lesion, pain is present with direct pressure to the lesion with a central nucleated core noted, no webspace macerations, no ecchymosis bilateral, all nails x 10 are well manicured except bilateral hallux where there is mild thickening and subungual debris noted distally and a small crack at the distal aspect of the left hallux nail with no surrounding signs of infection.  Vascular: Dorsalis Pedis and Posterior Tibial pedal pulses 2/4, Capillary Fill Time 3 seconds, + pedal hair growth bilateral, no edema bilateral lower extremities, Temperature gradient within normal limits.  Neurology: Michaell Cowing sensation intact via light touch bilateral.  Musculoskeletal: Mild tenderness with palpation at the keratotic lesion site on left greater than right with prominent fifth metatarsal head, tailor's bunion, muscular strength 5/5 in all groups without pain or limitation on range of motion.   Assessment and Plan: Problem List Items Addressed This Visit   None Visit Diagnoses     Nail fungus    -  Primary  Relevant Orders   Fungus culture w smear   Callus       Foot pain, bilateral       Prominent metatarsal head, unspecified laterality       Tailor's bunion of both feet           -Complete examination performed -Discussed treatment options -Parred keratoic lesion using a chisel blade x2; treated the area withSalinocaine covered with Band-Aid -Encouraged daily skin emollients; sample of foot miracle cream provided -Encouraged use of pumice stone -Advised good supportive shoes and inserts; discussed with  patient in the long-term may benefit from custom functional foot orthotics -Fungal culture was obtained by removing a portion of the hard nail itself from each of the involved bilateral first toenails using a sterile nail nipper and sent to [facility] lab. Patient tolerated the biopsy procedure well without discomfort or need for anesthesia.  -Patient to return to office in 1 month for biopsy results and follow-up on calluses or sooner if condition worsens.  Asencion Islam, DPM

## 2021-07-12 ENCOUNTER — Ambulatory Visit (INDEPENDENT_AMBULATORY_CARE_PROVIDER_SITE_OTHER): Payer: Medicare Other | Admitting: Sports Medicine

## 2021-07-12 ENCOUNTER — Other Ambulatory Visit: Payer: Self-pay

## 2021-07-12 DIAGNOSIS — B351 Tinea unguium: Secondary | ICD-10-CM | POA: Diagnosis not present

## 2021-07-12 DIAGNOSIS — M79672 Pain in left foot: Secondary | ICD-10-CM

## 2021-07-12 DIAGNOSIS — L84 Corns and callosities: Secondary | ICD-10-CM | POA: Diagnosis not present

## 2021-07-12 DIAGNOSIS — M79671 Pain in right foot: Secondary | ICD-10-CM

## 2021-07-12 DIAGNOSIS — M216X9 Other acquired deformities of unspecified foot: Secondary | ICD-10-CM

## 2021-07-12 DIAGNOSIS — M21622 Bunionette of left foot: Secondary | ICD-10-CM

## 2021-07-12 DIAGNOSIS — L909 Atrophic disorder of skin, unspecified: Secondary | ICD-10-CM

## 2021-07-12 DIAGNOSIS — M21621 Bunionette of right foot: Secondary | ICD-10-CM

## 2021-07-12 NOTE — Progress Notes (Signed)
Subjective: Katelyn Copeland is a 66 y.o. female patient seen today in office for fungal culture results. Patient reports that the soaking with vinegar and using the cream for the callus seems to be helping.  Patient has no other pedal complaints at this time.   Patient Active Problem List   Diagnosis Date Noted   Unilateral primary osteoarthritis, left hip 05/08/2018   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   Urinary frequency 09/23/2011   Vaginal discharge 09/23/2011   Incontinent of urine 09/23/2011   Monilial vaginitis 09/23/2011    Current Outpatient Medications on File Prior to Visit  Medication Sig Dispense Refill   amoxicillin (AMOXIL) 500 MG tablet Take 2 tabs by mouth one hour before dental appointment, then 2 by mouth six hours after 8 tablet 0   Biotin 1000 MCG tablet Take 1,000 mcg by mouth daily.     cetirizine (ZYRTEC) 10 MG tablet Take 10 mg by mouth at bedtime.      cholecalciferol (VITAMIN D3) 25 MCG (1000 UT) tablet Take 1,000 Units by mouth daily.     fish oil-omega-3 fatty acids 1000 MG capsule Take 2 g by mouth daily.     fluticasone (FLONASE) 50 MCG/ACT nasal spray Place 2 sprays into both nostrils daily as needed for allergies.   12   LORazepam (ATIVAN) 1 MG tablet Take 0.5 mg by mouth every 8 (eight) hours as needed for anxiety.      metoprolol tartrate (LOPRESSOR) 100 MG tablet Take 1 tablet (100 mg total) by mouth as directed. 1 tablet 0   rosuvastatin (CRESTOR) 20 MG tablet TAKE 1 TABLET BY MOUTH EVERY DAY 30 tablet 11   triamcinolone cream (KENALOG) 0.1 % Apply 1 application topically 2 (two) times daily. 30 g 0   No current facility-administered medications on file prior to visit.    Allergies  Allergen Reactions   Erythromycin Nausea And Vomiting    Objective: Physical Exam  General: Alert and oriented x3 in no acute distress   Dermatology: Minimal keratotic lesion present submet 5 bilateral with skin lines transversing the lesion,  no significant pain is present with direct pressure to the lesion, no ecchymosis bilateral, all nails x 10 are well manicured except bilateral hallux where there is mild thickening and subungual debris noted distally and a small crack at the distal aspect of the left hallux nail with no surrounding signs of infection that appears to be improving since last encounter.   Vascular: Dorsalis Pedis and Posterior Tibial pedal pulses 2/4, Capillary Fill Time 3 seconds, + pedal hair growth bilateral, no edema bilateral lower extremities, Temperature gradient within normal limits.   Neurology: Johney Maine sensation intact via light touch bilateral.   Musculoskeletal: No significant reproducible tenderness with palpation at the keratotic lesion site on left and right foot with prominent fifth metatarsal head, tailor's bunion, fat pad atrophy, muscular strength 5/5 in all groups without pain or limitation on range of motion.    Fungal culture-negative for fungus suggestive of microtrauma  Assessment and Plan:  Problem List Items Addressed This Visit   None Visit Diagnoses     Nail fungus    -  Primary   Callus       Foot pain, bilateral       Prominent metatarsal head, unspecified laterality       Tailor's bunion of both feet       Fat pad atrophy of foot           -  Examined patient -Discussed treatment options for nails fungal culture negative -At this time patient wants to hold off on trying topical urea and will continue with soaking with vinegar since it is helping -Educated patient on proper continued care of feet and calluses and advised her to continue with foot miracle cream -Patient to return as needed or may call for prescription of topical urea from [place] if over the next 4 to 6 months nails to fail to continue to improve.  Landis Martins, DPM

## 2021-08-15 ENCOUNTER — Telehealth: Payer: Self-pay | Admitting: Orthopaedic Surgery

## 2021-08-15 NOTE — Telephone Encounter (Signed)
FAXED

## 2021-08-15 NOTE — Telephone Encounter (Signed)
Jamie with [facility] called asking if pt needed any pre-meds prior to her appt. Pt is s/p total hip replacement 05/08/2018. I let her know that Dr. Roda Shutters does premeds for 2 years post op. She will need a letter faxed to them stating that pt will not need pre-meds any longer for her chart. Fax # [phone] ?

## 2021-09-14 ENCOUNTER — Other Ambulatory Visit: Payer: Self-pay | Admitting: Obstetrics and Gynecology

## 2021-09-14 DIAGNOSIS — Z13820 Encounter for screening for osteoporosis: Secondary | ICD-10-CM

## 2021-10-24 ENCOUNTER — Ambulatory Visit: Payer: Medicare Other | Attending: Physician Assistant

## 2021-10-24 DIAGNOSIS — R42 Dizziness and giddiness: Secondary | ICD-10-CM | POA: Diagnosis present

## 2021-10-24 DIAGNOSIS — M542 Cervicalgia: Secondary | ICD-10-CM | POA: Diagnosis present

## 2021-10-24 NOTE — Therapy (Signed)
OUTPATIENT PHYSICAL THERAPY VESTIBULAR EVALUATION     Patient Name: Katelyn Copeland MRN: [id number] DOB:[birth year], 66 y.o., female Today's Date: 10/24/2021  PCP: Laurann Montana, MD REFERRING PROVIDER: Ceasar Lund, PA   PT End of Session - 10/24/21 1312     Visit Number 1    Number of Visits 4    Date for PT Re-Evaluation 11/21/21    Authorization Type Medicare/Medicaid    PT Start Time 1315    PT Stop Time 1400    PT Time Calculation (min) 45 min    Activity Tolerance Patient tolerated treatment well    Behavior During Therapy [facility] for tasks assessed/performed             Past Medical History:  Diagnosis Date   Anxiety    Arthritis    Complication of anesthesia    H/O cardiovascular stress test 2011   normal myo stsiudy   H/O Doppler ultrasound 2003   normal study   H/O echocardiogram 2011   normal study   H/O osteopenia    H/O: hematuria    Hyperlipidemia    Incontinent of urine 09/23/2011   Monilial vaginitis 09/23/2011   PONV (postoperative nausea and vomiting)    Pre-diabetes    Shingles    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   Unilateral primary osteoarthritis, left hip 05/08/2018   Urinary frequency 09/23/2011   Vaginal discharge 09/23/2011   Vaginal prolapse    h/o   Past Surgical History:  Procedure Laterality Date   ABDOMINAL HYSTERECTOMY     COLONOSCOPY     JOINT REPLACEMENT     Left hip [name] 05-08-18   TOTAL HIP ARTHROPLASTY Left 05/08/2018   Procedure: LEFT TOTAL HIP ARTHROPLASTY ANTERIOR APPROACH;  Surgeon: Kathryne Hitch, MD;  Location: [facility];  Service: Orthopedics;  Laterality: Left;   tubes tied     Patient Active Problem List   Diagnosis Date Noted   Unilateral primary osteoarthritis, left hip 05/08/2018   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 Pt notes that presently her symptoms have resolved but has been having some lingering effects since the last episode. Denies sensitivity to sound/light. Pt notes she would feel spinning in the morning when she would arise. Patient reports hx of chronic neck pain but has experienced acute exacerbation likely related to tension from vertigo per her report Pt accompanied by: self  PERTINENT HISTORY: neck pain   PAIN:  Are you having pain? Yes: NPRS scale: 5/10 Pain location: neck right > left Pain description: grinding, ache, sore Aggravating factors: movement, in the AM Relieving factors: rest, massage  PRECAUTIONS: None  WEIGHT BEARING RESTRICTIONS No  FALLS: Has patient fallen in last 6 months? No  LIVING ENVIRONMENT: Lives with: lives alone Lives in: House/apartment Stairs: No Has following equipment at home: None  PLOF: Independent  PATIENT GOALS Be able to garden, eliminate fear of dizziness when awakening  OBJECTIVE:   DIAGNOSTIC FINDINGS: no imaging or ENT   COGNITION: Overall cognitive status: Within functional limits for  tasks assessed   SENSATION: WFL      POSTURE: No Significant postural limitations   Cervical ROM:    Active A/PROM (deg) eval  Flexion WNL  Extension 10% limited  Right lateral flexion 10% limited  Left lateral flexion 10% limited  Right rotation 20% limited  Left rotation 20% limited  (Blank rows = not tested)  STRENGTH: DNT   BED MOBILITY:  Independent  TRANSFERS: Independent   GAIT: Gait pattern: WFL Distance walked: WNL Assistive device utilized: None Level of assistance: Complete Independence Comments:    FUNCTIONAL TESTs:  Dynamic Gait Index: 22/24  M-CTSIB  Condition 1: Firm Surface, EO 30 Sec, Normal Sway  Condition 2: Firm Surface, EC 30 Sec, Mild Sway  Condition 3: Foam Surface, EO 30 Sec, Mild Sway  Condition 4: Foam Surface, EC 30 Sec, Moderate Sway     PATIENT SURVEYS:  N/a     VESTIBULAR ASSESSMENT   GENERAL OBSERVATION: wears bifocals    SYMPTOM BEHAVIOR:   Subjective history: sensation of head spinning   Non-Vestibular symptoms: neck pain   Type of dizziness: Spinning/Vertigo and "Funny feeling in the head"   Frequency: in the morning   Duration: 2 weeks   Aggravating factors: Spontaneous, Induced by position change: supine to sit, and Worse in the morning   Relieving factors: head stationary and lying supine   Progression of symptoms: better   OCULOMOTOR EXAM:   Ocular Alignment: normal   Ocular ROM: No Limitations   Spontaneous Nystagmus: absent   Gaze-Induced Nystagmus: absent   Smooth Pursuits: intact   Saccades: intact   Convergence/Divergence: 6 cm       VESTIBULAR - OCULAR REFLEX:    Slow VOR: Normal   VOR Cancellation: Comment: DNT   Head-Impulse Test: HIT Right: negative HIT Left: negative   Dynamic Visual Acuity: Not able to be assessed    POSITIONAL TESTING: Right Dix-Hallpike: none; Duration:n/a Left Dix-Hallpike: none; Duration: n/a Right Roll Test: none; Duration: n/a Left Roll Test: none; Duration: n/a    MOTION SENSITIVITY:    Motion Sensitivity Quotient  Intensity: 0 = none, 1 = Lightheaded, 2 = Mild, 3 = Moderate, 4 = Severe, 5 = Vomiting  Intensity  1. Sitting to supine   2. Supine to L side   3. Supine to R side   4. Supine to sitting   5. L Hallpike-Dix   6. Up from L    7. R Hallpike-Dix   8. Up from R    9. Sitting, head  tipped to L knee   10. Head up from L  knee   11. Sitting, head  tipped to R knee   12. Head up from R  knee   13. Sitting head turns x5   14.Sitting head nods x5   15. In stance, 180   turn to L    16. In stance, 180  turn to R     OTHOSTATICS: not done    VESTIBULAR TREATMENT:  Canalith Repositioning:   Comment: not indicated Gaze Adaptation:    Habituation:    Other: static standing balance on compliant surface  PATIENT EDUCATION: Education details: regarding assessment findings and nature of BPPV Person educated: Patient Education method: Explanation Education comprehension: verbalized understanding   GOALS: Goals reviewed with patient? Yes  SHORT TERM GOALS: Target date: 11/07/2021   Patient will be independent in HEP to improve functional outcomes Baseline: Goal status: INITIAL  2.  Pt will remain free of vertigo symptoms  for improved quality of life Baseline:  Goal status: INITIAL    LONG TERM GOALS: Target date: 11/21/2021    Patient will demo independence with balance activities and HEP for neck pain Baseline: 5/10 neck pain Goal status: INITIAL    ASSESSMENT:  CLINICAL IMPRESSION: Patient is a 66 y.o. lady who was seen today for physical therapy evaluation and treatment for dizziness and neck pain. Positional tests do not provoke any symptoms but demonstrates some lingering balance/postural stability as evidenced by unsteadiness when standing on compliant surfaces and eyes closed as well as unsteadiness when walking and performing head turns during Dynamic Gait Index test.  Of note, pt reports increase in neck pain since this episode of vertigo and demonstrates muscle guarding and limited ROM which would benefit from PT intervention to improve ROM and reduce pain to improve comfort and quality of life.    OBJECTIVE IMPAIRMENTS decreased activity tolerance, decreased balance, decreased ROM, dizziness, increased muscle spasms, and pain.   ACTIVITY LIMITATIONS carrying and bending  PARTICIPATION LIMITATIONS: cleaning and yard work  PERSONAL FACTORS Age are also affecting patient's functional outcome.   REHAB POTENTIAL:  Excellent  CLINICAL DECISION MAKING: Stable/uncomplicated  EVALUATION COMPLEXITY: Low   PLAN: PT FREQUENCY: 1x/week  PT DURATION: 4 weeks  PLANNED INTERVENTIONS: Therapeutic exercises, Therapeutic activity, Neuromuscular re-education, Balance training, Gait training, Patient/Family education, Joint mobilization, Vestibular training, Canalith repositioning, Dry Needling, Electrical stimulation, Spinal mobilization, Cryotherapy, Moist heat, Taping, and Manual therapy  PLAN FOR NEXT SESSION: neck pain activities: posture/stretching/mobilization. Assess for BPPV if symptoms return   5:51 PM, 10/24/21 M. Shary DecampKelly Keysi Oelkers, PT, DPT Physical Therapist- [place] Office Number: [phone]

## 2021-10-30 ENCOUNTER — Ambulatory Visit: Payer: Medicare Other

## 2021-11-07 ENCOUNTER — Ambulatory Visit: Payer: Medicare Other

## 2021-11-13 ENCOUNTER — Ambulatory Visit: Payer: Medicare Other

## 2021-11-20 ENCOUNTER — Ambulatory Visit: Payer: Medicare Other

## 2021-11-21 ENCOUNTER — Other Ambulatory Visit: Payer: Self-pay | Admitting: Cardiology

## 2021-12-25 ENCOUNTER — Other Ambulatory Visit: Payer: Self-pay | Admitting: Cardiology

## 2022-01-21 ENCOUNTER — Other Ambulatory Visit: Payer: Self-pay | Admitting: Cardiology

## 2022-03-08 ENCOUNTER — Other Ambulatory Visit: Payer: Self-pay | Admitting: Cardiology

## 2022-03-08 ENCOUNTER — Ambulatory Visit
Admission: RE | Admit: 2022-03-08 | Discharge: 2022-03-08 | Disposition: A | Discharge status: Home / Self Care | Payer: Medicare Other | Source: Ambulatory Visit | Attending: Obstetrics and Gynecology | Admitting: Obstetrics and Gynecology

## 2022-03-08 DIAGNOSIS — Z13820 Encounter for screening for osteoporosis: Secondary | ICD-10-CM

## 2022-03-31 ENCOUNTER — Other Ambulatory Visit: Payer: Self-pay | Admitting: Cardiology

## 2022-04-01 IMAGING — CR DG CHEST 2V
2 series · 2 of 2 positions shown · non-contrast
Comparison: Report 07/16/2002

CLINICAL DATA: Right-sided chest pain

EXAM:
CHEST - 2 VIEW

[w chest pa]
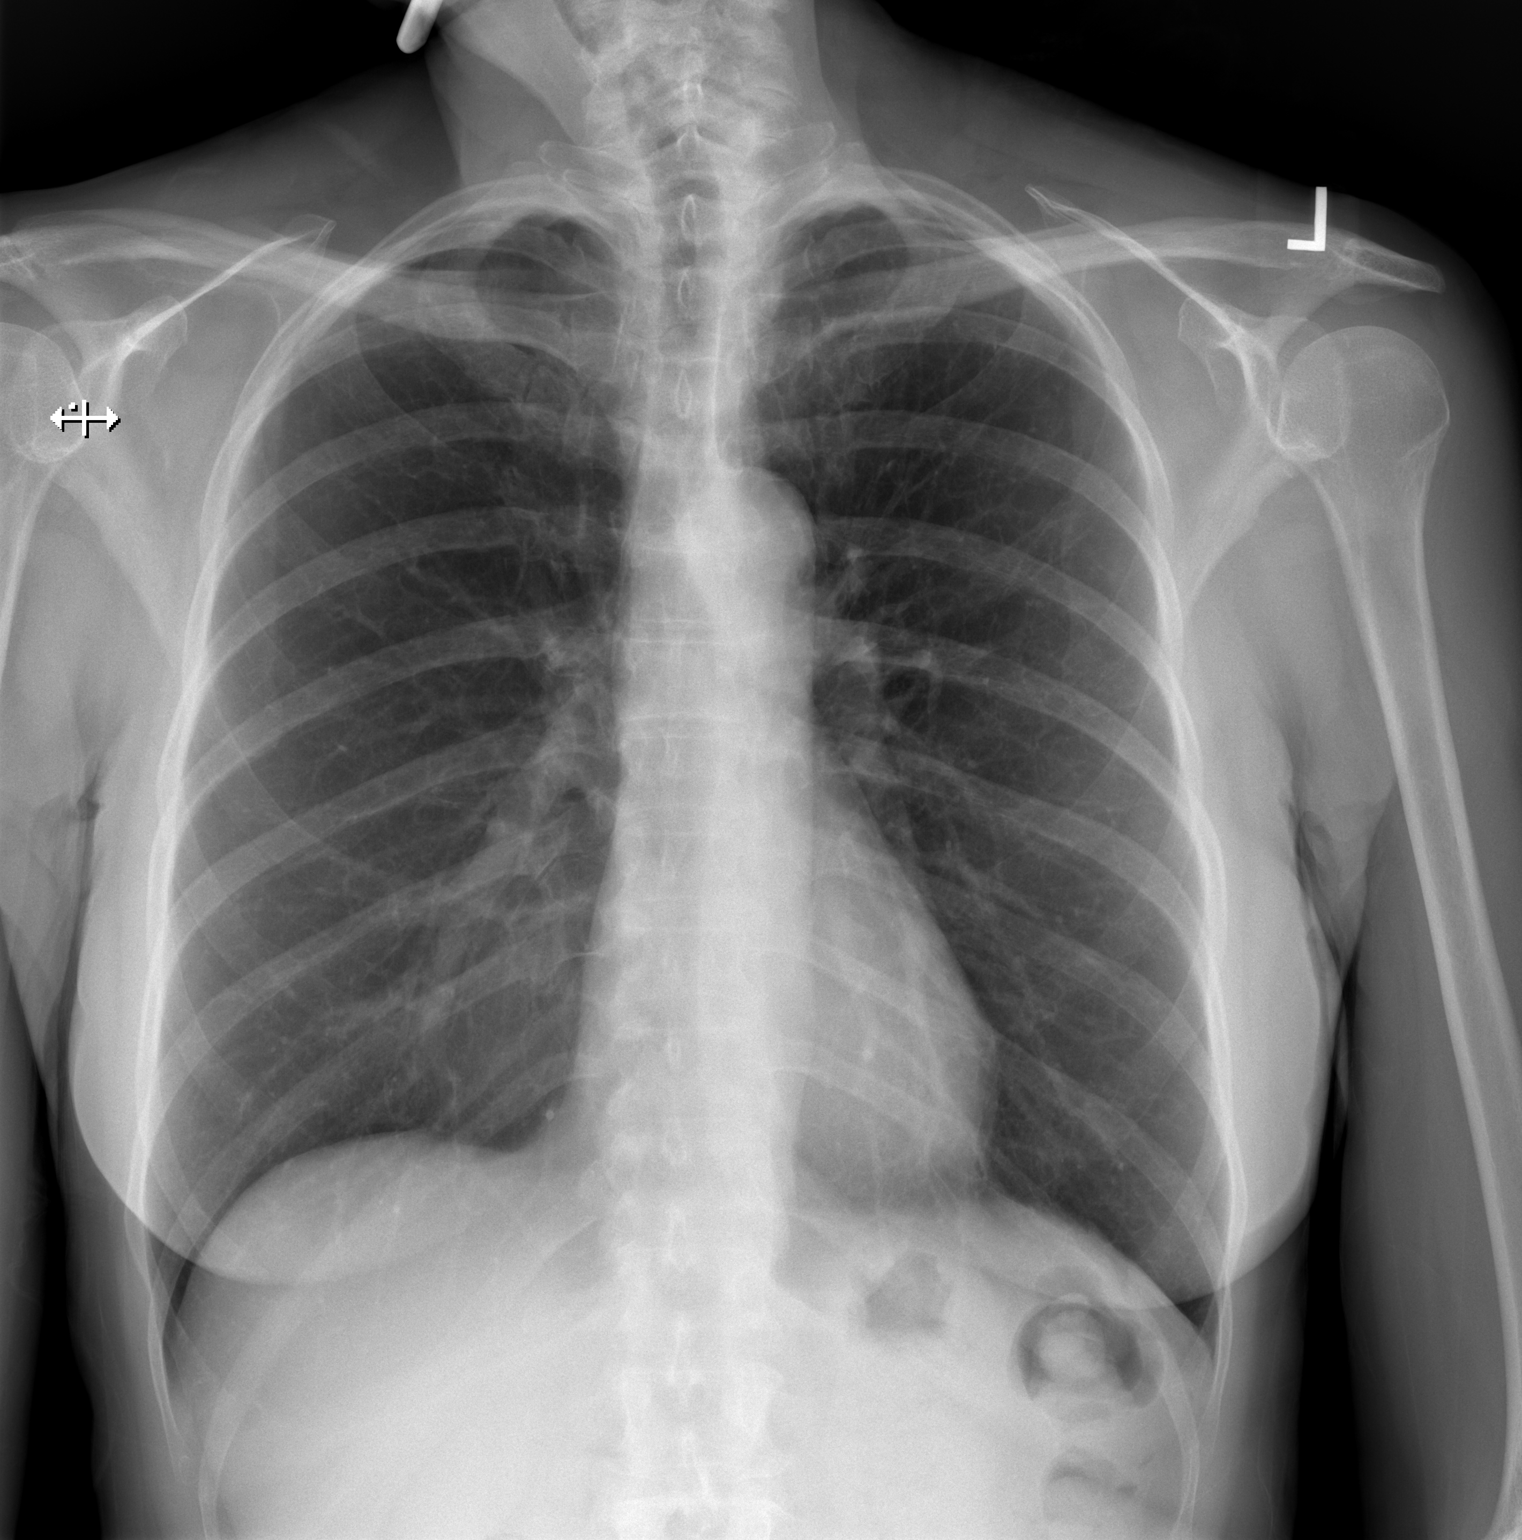

[w chest lat]
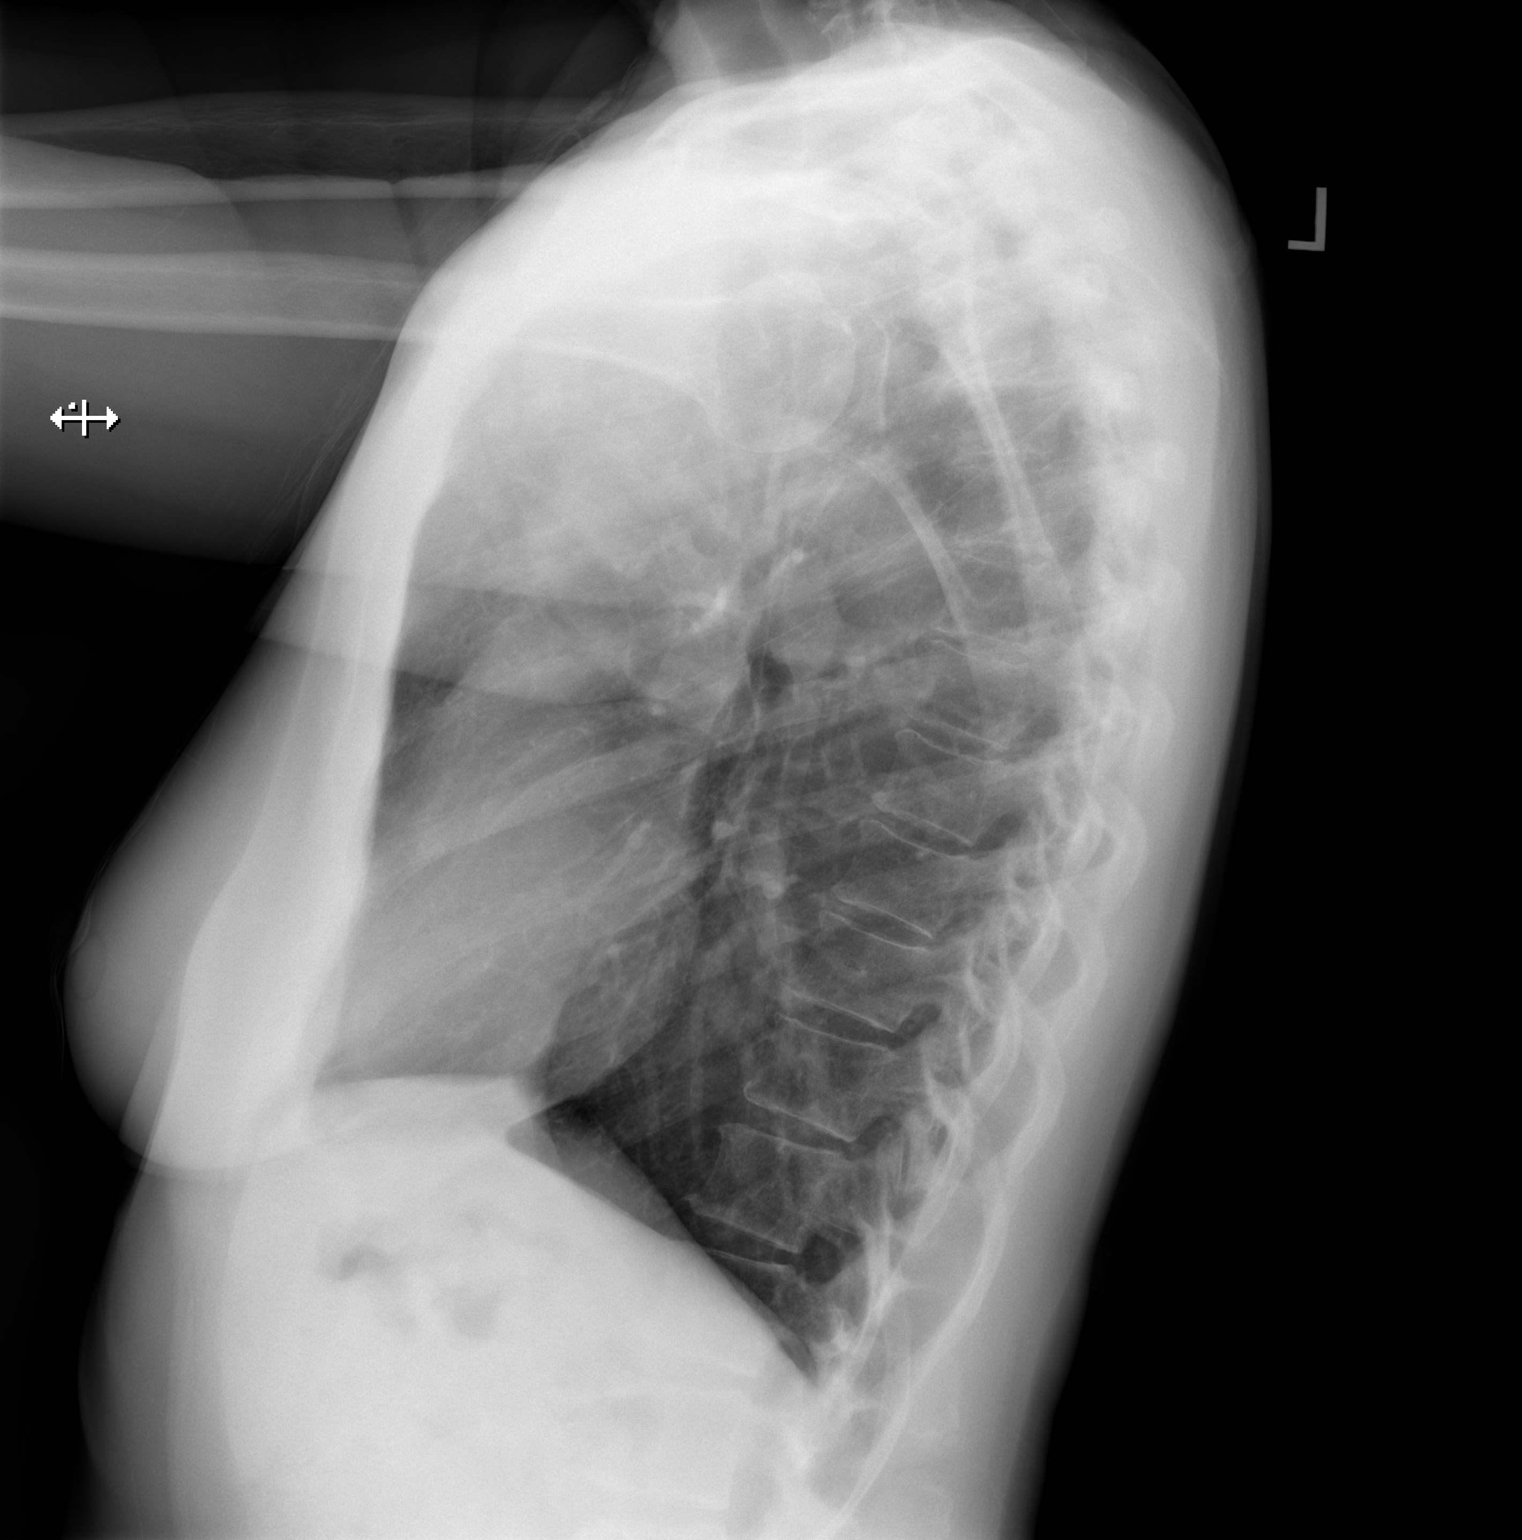

[2 of 2 positions shown; findings below may reference images not displayed]

FINDINGS: The heart size and mediastinal contours are within normal limits.
Both lungs are clear. The visualized skeletal structures are
unremarkable.
IMPRESSION: No active cardiopulmonary disease.

## 2022-04-11 ENCOUNTER — Other Ambulatory Visit: Payer: Self-pay | Admitting: Cardiology

## 2022-04-11 ENCOUNTER — Telehealth: Payer: Self-pay | Admitting: Cardiology

## 2022-04-11 DIAGNOSIS — Z8249 Family history of ischemic heart disease and other diseases of the circulatory system: Secondary | ICD-10-CM

## 2022-04-11 DIAGNOSIS — I340 Nonrheumatic mitral (valve) insufficiency: Secondary | ICD-10-CM

## 2022-04-11 DIAGNOSIS — E785 Hyperlipidemia, unspecified: Secondary | ICD-10-CM

## 2022-04-11 DIAGNOSIS — Z79899 Other long term (current) drug therapy: Secondary | ICD-10-CM

## 2022-04-11 MED ORDER — ROSUVASTATIN CALCIUM 20 MG PO TABS: 20.0000 mg | ORAL_TABLET | Freq: Every day | ORAL | 1 refills | Status: DC

## 2022-04-11 NOTE — Telephone Encounter (Signed)
*  STAT* If patient is at the pharmacy, call can be transferred to refill team.   1. Which medications need to be refilled? (please list name of each medication and dose if known)   rosuvastatin (CRESTOR) 20 MG tablet   2. Which pharmacy/location (including street and city if local pharmacy) is medication to be sent to?  [id number] - [place], [place] - [address]   3. Do they need a 30 day or 90 day supply? 90 days.    Patient stated she is completely out of this medication.  Patient has appointment scheduled on 06/21/22.

## 2022-04-11 NOTE — Telephone Encounter (Signed)
Patient wants to know if she will need to do fasting blood work prior to her appointment on 06/21/22.

## 2022-04-11 NOTE — Telephone Encounter (Signed)
Will have Dr Anne Fu to review for any needed labs

## 2022-04-11 NOTE — Telephone Encounter (Signed)
Pt's medication was sent to pt's pharmacy as requested. Confirmation received.  °

## 2022-04-15 NOTE — Telephone Encounter (Signed)
Pt aware of lab orders and appt has been scheduled.

## 2022-06-18 ENCOUNTER — Ambulatory Visit: Payer: Medicare Other | Attending: Cardiology

## 2022-06-18 DIAGNOSIS — E785 Hyperlipidemia, unspecified: Secondary | ICD-10-CM

## 2022-06-18 DIAGNOSIS — Z8249 Family history of ischemic heart disease and other diseases of the circulatory system: Secondary | ICD-10-CM

## 2022-06-18 DIAGNOSIS — I340 Nonrheumatic mitral (valve) insufficiency: Secondary | ICD-10-CM

## 2022-06-18 DIAGNOSIS — Z79899 Other long term (current) drug therapy: Secondary | ICD-10-CM

## 2022-06-18 LAB — CBC

## 2022-06-19 LAB — COMPREHENSIVE METABOLIC PANEL
ALT: 13 IU/L (ref 0–32)
AST: 21 IU/L (ref 0–40)
Albumin/Globulin Ratio: 1.8 (ref 1.2–2.2)
Albumin: 4.4 g/dL (ref 3.9–4.9)
Alkaline Phosphatase: 66 IU/L (ref 44–121)
BUN/Creatinine Ratio: 12 (ref 12–28)
BUN: 11 mg/dL (ref 8–27)
Bilirubin Total: 0.4 mg/dL (ref 0.0–1.2)
CO2: 24 mmol/L (ref 20–29)
Calcium: 9.6 mg/dL (ref 8.7–10.3)
Chloride: 106 mmol/L (ref 96–106)
Creatinine, Ser: 0.93 mg/dL (ref 0.57–1.00)
Globulin, Total: 2.5 g/dL (ref 1.5–4.5)
Glucose: 82 mg/dL (ref 70–99)
Potassium: 4.3 mmol/L (ref 3.5–5.2)
Sodium: 142 mmol/L (ref 134–144)
Total Protein: 6.9 g/dL (ref 6.0–8.5)
eGFR: 67 mL/min/{1.73_m2} (ref >59)

## 2022-06-19 LAB — LIPID PANEL
Chol/HDL Ratio: 2.7 ratio (ref 0.0–4.4)
Cholesterol, Total: 154 mg/dL (ref 100–199)
HDL: 58 mg/dL (ref >39)
LDL Chol Calc (NIH): 83 mg/dL (ref 0–99)
Triglycerides: 67 mg/dL (ref 0–149)
VLDL Cholesterol Cal: 13 mg/dL (ref 5–40)

## 2022-06-19 LAB — CBC
Hematocrit: 37.4 % (ref 34.0–46.6)
Hemoglobin: 11.9 g/dL (ref 11.1–15.9)
MCH: 26.1 pg — ABNORMAL LOW (ref 26.6–33.0)
MCHC: 31.8 g/dL (ref 31.5–35.7)
MCV: 82 fL (ref 79–97)
Platelets: 210 10*3/uL (ref 150–450)
RBC: 4.56 x10E6/uL (ref 3.77–5.28)
RDW: 14.2 % (ref 11.7–15.4)
WBC: 4.7 10*3/uL (ref 3.4–10.8)

## 2022-06-21 ENCOUNTER — Ambulatory Visit: Payer: Medicare Other | Attending: Cardiology | Admitting: Cardiology

## 2022-06-21 ENCOUNTER — Encounter: Payer: Self-pay | Admitting: Cardiology

## 2022-06-21 VITALS — BP 110/70 | HR 66 | Ht 67.5 in | Wt 165.8 lb

## 2022-06-21 DIAGNOSIS — E785 Hyperlipidemia, unspecified: Secondary | ICD-10-CM | POA: Diagnosis not present

## 2022-06-21 DIAGNOSIS — I340 Nonrheumatic mitral (valve) insufficiency: Secondary | ICD-10-CM | POA: Diagnosis present

## 2022-06-21 DIAGNOSIS — I2510 Atherosclerotic heart disease of native coronary artery without angina pectoris: Secondary | ICD-10-CM

## 2022-06-21 MED ORDER — EZETIMIBE 10 MG PO TABS: 10.0000 mg | ORAL_TABLET | Freq: Every day | ORAL | 3 refills | Status: DC

## 2022-06-21 NOTE — Progress Notes (Signed)
Cardiology Office Note:    Date:  06/21/2022   ID:  [name], [name] 11/30/1955, MRN [id number]  PCP:  Harlan Stains, MD  Cardiologist:  Candee Furbish, MD  Electrophysiologist:  None   Referring MD: Harlan Stains, MD     History of Present Illness:    Katelyn Copeland is a 67 y.o. female here for the evaluation of chest pain at the request of Dr. Dema Severin.  Has been treated recently for possible GERD with omeprazole, PPI but still is having occasional chest pressure pain.  For instance in the evening she will work out on her treadmill and does not feel quite right.  Sometimes have some less chest and middle of the chest sensation.  This can also help while she is typing during the day at work.  Increased stress during Covid working at home.  She sometimes has left neck shooting discomfort.  While on the treadmill her heart rate feels faster, unusual, mild pressure.  Last summer however she was able to cut the grass without any difficulty.  She received her first Covid vaccine.  In college she smoked only.  Her father died at age 35 from congestive heart failure.  She is not have diabetes but she has been diagnosed with borderline diabetes with hemoglobin A1c of 6.5.  Her total cholesterol in the past has been 200 LDL 130 and she has been offered statin before.  Currently not taking.  Hemoglobin 9.9 in 2019 creatinine 0.9 in 2019 ALT 21.  On May 09, 2019 she did undergo hip replacement without difficulty.  Mother - pacer, Father - CHF. 2 brothers ok, not close.  Winded at [employer]. Hard to get HR up to 119.   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   Unilateral primary osteoarthritis, left hip 05/08/2018   Urinary frequency 09/23/2011   Vaginal discharge 09/23/2011   Vaginal prolapse    h/o    Past Surgical History:  Procedure Laterality Date   ABDOMINAL HYSTERECTOMY     COLONOSCOPY     JOINT REPLACEMENT     Left hip Zollie Beckers 05-08-18   TOTAL HIP ARTHROPLASTY Left 05/08/2018   Procedure: LEFT TOTAL HIP ARTHROPLASTY ANTERIOR APPROACH;  Surgeon: Mcarthur Rossetti, MD;  Location: [facility];  Service: Orthopedics;  Laterality: Left;   tubes tied      Current Medications: Current Meds  Medication Sig   amoxicillin (AMOXIL) 500 MG tablet Take 2 tabs by mouth one hour before dental appointment, then 2 by mouth six hours after   Ascorbic Acid (VITAMIN C) 1000 MG tablet Take 1,000 mg by mouth every other day.   aspirin EC 81 MG tablet Take by mouth daily.   Biotin 1000 MCG tablet Take 1,000 mcg by mouth daily.   Biotin 10000 MCG TABS Take by mouth daily at 6 (six) AM.   Black Elderberry 1.9 GM/5ML SYRP Take 1,000 mg by mouth daily at 6 (  six) AM.   cetirizine (ZYRTEC) 10 MG tablet Take 10 mg by mouth at bedtime.    cholecalciferol (VITAMIN D3) 25 MCG (1000 UT) tablet Take 1,000 Units by mouth daily.   Coenzyme Q10 (COQ-10) 100 MG CAPS Take by mouth daily at 6 (six) AM.   cyanocobalamin (VITAMIN B12) 1000 MCG tablet Take 1,000 mcg by mouth daily.   ezetimibe (ZETIA) 10 MG tablet Take 1 tablet (10 mg total) by mouth daily.   fluticasone (FLONASE) 50 MCG/ACT nasal spray Place 2 sprays into both nostrils daily as needed for allergies.    lactase (LACTAID) 3000 units tablet Take by mouth as needed (dairy).   LORazepam (ATIVAN) 1 MG tablet Take 0.5 mg by mouth every 8 (eight) hours as needed for anxiety.    Magnesium 200 MG TABS Take by mouth as needed (ACID REFLUX).   rosuvastatin (CRESTOR) 20 MG tablet  Take 1 tablet (20 mg total) by mouth daily.   Turmeric (QC TUMERIC COMPLEX PO) Take 450 mg by mouth daily at 6 (six) AM.   zinc gluconate 50 MG tablet Take 50 mg by mouth every other day.     Allergies:   Erythromycin, Oxycodone, and Penicillins   Social History   Socioeconomic History   Marital status: Single    Spouse name: Not on file   Number of children: Not on file   Years of education: Not on file   Highest education level: Not on file  Occupational History   Not on file  Tobacco Use   Smoking status: Former    Types: Cigarettes    Passive exposure: Past   Smokeless tobacco: Never   Tobacco comments:    quit when 73 -57 years old  Vaping Use   Vaping Use: Never used  Substance and Sexual Activity   Alcohol use: No    Comment: rare   Drug use: No   Sexual activity: Yes    Partners: Male    Birth control/protection: Other-see comments, Post-menopausal    Comment: pt has had hysterectomy  Other Topics Concern   Not on file  Social History Narrative   Not on file   Social Determinants of Health   Financial Resource Strain: Not on file  Food Insecurity: Not on file  Transportation Needs: Not on file  Physical Activity: Not on file  Stress: Not on file  Social Connections: Not on file     Family History: The patient's family history includes Heart disease in her father, maternal grandfather, mother, and paternal grandmother.  ROS:   Please see the history of present illness.    No bleeding no syncope no fevers no chills all other systems reviewed and are negative.  EKGs/Labs/Other Studies Reviewed:    The following studies were reviewed today:  Echocardiogram 2011 showed mild mitral vegetation mild to moderate tricuspid regurgitation normal EF.  Nuclear stress test 2016 was low risk with no ischemia  ECHO  2021:   1. Left ventricular ejection fraction, by estimation, is 60 to 65%. Left  ventricular ejection fraction by PLAX is 62 %. The left ventricle  has  normal function. The left ventricle has no regional wall motion  abnormalities. Left ventricular diastolic  parameters are consistent with Grade I diastolic dysfunction (impaired  relaxation).   2. Right ventricular systolic function is normal. The right ventricular  size is normal. There is normal pulmonary artery systolic pressure.   3. The mitral valve is normal in structure and function. Mild mitral  valve  regurgitation. No evidence of mitral stenosis.   4. The aortic valve is tricuspid. Aortic valve regurgitation is trivial.  No aortic stenosis is present. Aortic regurgitation PHT measures 593 msec.   5. The inferior vena cava is normal in size with greater than 50%  respiratory variability, suggesting right atrial pressure of 3 mmHg.   CT coronary: 2021 1. Coronary calcium score of 53. This was 40 percentile for age and sex matched control.   2. Normal coronary origin with right dominance.   3. There is mixed calcified and non calcified plaque in the proximal LAD with moderate stenosis (50-69%). Will send for CT-FFR analysis.   4.  Mild aortic atherosclerosis.  EKG:  EKG is ordered today.   06/21/2022-normal sinus rhythm 66 with nonspecific T wave changes. Prior sinus rhythm 60 no other significant abnormalities.  Recent Labs: 06/18/2022: ALT 13; BUN 11; Creatinine, Ser 0.93; Hemoglobin 11.9; Platelets 210; Potassium 4.3; Sodium 142  Recent Lipid Panel    Component Value Date/Time   CHOL 154 06/18/2022 0926   TRIG 67 06/18/2022 0926   HDL 58 06/18/2022 0926   CHOLHDL 2.7 06/18/2022 0926   CHOLHDL 3.8 02/28/2015 0830   VLDL 22 02/28/2015 0830   LDLCALC 83 06/18/2022 0926    Physical Exam:    VS:  BP 110/70   Pulse 66   Ht 5' 7.5" (1.715 m)   Wt 165 lb 12.8 oz (75.2 kg)   SpO2 98%   BMI 25.58 kg/m     Wt Readings from Last 3 Encounters:  06/21/22 165 lb 12.8 oz (75.2 kg)  08/15/20 160 lb (72.6 kg)  07/02/19 163 lb 9.6 oz (74.2 kg)     GEN:  Well  nourished, well developed in no acute distress HEENT: Normal NECK: No JVD; No carotid bruits LYMPHATICS: No lymphadenopathy CARDIAC: RRR, soft systolic murmur, no rubs, gallops RESPIRATORY:  Clear to auscultation without rales, wheezing or rhonchi  ABDOMEN: Soft, non-tender, non-distended MUSCULOSKELETAL:  No edema; No deformity  SKIN: Warm and dry NEUROLOGIC:  Alert and oriented x 3 PSYCHIATRIC:  Normal affect   ASSESSMENT:    1. Coronary artery disease involving native coronary artery of native heart without angina pectoris   2. Nonrheumatic mitral valve regurgitation   3. Hyperlipidemia, unspecified hyperlipidemia type     PLAN:    In order of problems listed above:  Coronary artery disease - Moderate plaque noted on coronary CT scan as above.  Aggressive secondary risk factor prevention.  Aspirin 81 mg, statin.  She does have some mild shortness of breath with activity but overall no anginal symptoms. - Continue to exercise at [employer].  She is retired [employer].  She grew up in [place].  Hyperlipidemia -Prior total cholesterol 200.  Last LDL was 83 above goal of less than 70.  We will add Zetia 10 mg to her Crestor 20 mg.  Will have her come back in in 2 months for lipid panel.    Medication Adjustments/Labs and Tests Ordered: Current medicines are reviewed at length with the patient today.  Concerns regarding medicines are outlined above.  Orders Placed This Encounter  Procedures   Lipid panel   EKG 12-Lead   Meds ordered this encounter  Medications   ezetimibe (ZETIA) 10 MG tablet    Sig: Take 1 tablet (10 mg total) by mouth daily.    Dispense:  90 tablet    Refill:  3  Patient Instructions  Medication Instructions:  Please start Zetia 10 mg once a day. Continue all other medications as listed.  *If you need a refill on your cardiac medications before your next appointment, please  call your pharmacy*   Lab Work: Please have blood work in 2 months (Lipid)  If you have labs (blood work) drawn today and your tests are completely normal, you will receive your results only by: [place] (if you have MyChart) OR A paper copy in the mail If you have any lab test that is abnormal or we need to change your treatment, we will call you to review the results.   Follow-Up: At [facility], you and your health needs are our priority.  As part of our continuing mission to provide you with exceptional heart care, we have created designated Provider Care Teams.  These Care Teams include your primary Cardiologist (physician) and Advanced Practice Providers (APPs -  Physician Assistants and Nurse Practitioners) who all work together to provide you with the care you need, when you need it.  We recommend signing up for the patient portal called "MyChart".  Sign up information is provided on this After Visit Summary.  MyChart is used to connect with patients for Virtual Visits (Telemedicine).  Patients are able to view lab/test results, encounter notes, upcoming appointments, etc.  Non-urgent messages can be sent to your provider as well.   To learn more about what you can do with MyChart, go to [email or web address].    Your next appointment:   1 year(s)  Provider:   Candee Furbish, MD        Signed, Candee Furbish, MD  06/21/2022 12:19 PM    [place]

## 2022-06-21 NOTE — Patient Instructions (Signed)
Medication Instructions:  Please start Zetia 10 mg once a day. Continue all other medications as listed.  *If you need a refill on your cardiac medications before your next appointment, please call your pharmacy*   Lab Work: Please have blood work in 2 months (Lipid)  If you have labs (blood work) drawn today and your tests are completely normal, you will receive your results only by: [place] (if you have MyChart) OR A paper copy in the mail If you have any lab test that is abnormal or we need to change your treatment, we will call you to review the results.   Follow-Up: At [facility], you and your health needs are our priority.  As part of our continuing mission to provide you with exceptional heart care, we have created designated Provider Care Teams.  These Care Teams include your primary Cardiologist (physician) and Advanced Practice Providers (APPs -  Physician Assistants and Nurse Practitioners) who all work together to provide you with the care you need, when you need it.  We recommend signing up for the patient portal called "MyChart".  Sign up information is provided on this After Visit Summary.  MyChart is used to connect with patients for Virtual Visits (Telemedicine).  Patients are able to view lab/test results, encounter notes, upcoming appointments, etc.  Non-urgent messages can be sent to your provider as well.   To learn more about what you can do with MyChart, go to [email or web address].    Your next appointment:   1 year(s)  Provider:   Candee Furbish, MD

## 2022-06-23 ENCOUNTER — Other Ambulatory Visit: Payer: Self-pay | Admitting: Cardiology

## 2022-08-22 ENCOUNTER — Ambulatory Visit: Payer: Medicare Other | Attending: Cardiology

## 2022-08-22 DIAGNOSIS — E785 Hyperlipidemia, unspecified: Secondary | ICD-10-CM

## 2022-08-23 LAB — LIPID PANEL
Chol/HDL Ratio: 2.2 ratio (ref 0.0–4.4)
Cholesterol, Total: 122 mg/dL (ref 100–199)
HDL: 56 mg/dL (ref >39)
LDL Chol Calc (NIH): 54 mg/dL (ref 0–99)
Triglycerides: 50 mg/dL (ref 0–149)
VLDL Cholesterol Cal: 12 mg/dL (ref 5–40)

## 2023-02-04 ENCOUNTER — Telehealth: Payer: Self-pay | Admitting: Cardiology

## 2023-02-04 DIAGNOSIS — Z79899 Other long term (current) drug therapy: Secondary | ICD-10-CM

## 2023-02-04 NOTE — Telephone Encounter (Signed)
Patient would like labs ordered to be done in January before her appointment with Dr. Anne Fu

## 2023-02-04 NOTE — Telephone Encounter (Signed)
Pt asking if she can have blood work scheduled prior to her appt with Dr. Anne Fu

## 2023-02-06 NOTE — Telephone Encounter (Signed)
Labs have been orderd

## 2023-03-24 ENCOUNTER — Encounter (INDEPENDENT_AMBULATORY_CARE_PROVIDER_SITE_OTHER): Payer: Self-pay

## 2023-04-28 ENCOUNTER — Ambulatory Visit: Payer: Medicare Other | Admitting: Dermatology

## 2023-06-25 ENCOUNTER — Other Ambulatory Visit: Payer: Medicare Other

## 2023-06-25 ENCOUNTER — Encounter: Payer: Self-pay | Admitting: Cardiology

## 2023-06-25 ENCOUNTER — Ambulatory Visit: Payer: Medicare Other | Attending: Cardiology | Admitting: Cardiology

## 2023-06-25 VITALS — BP 112/78 | HR 68 | Ht 67.5 in | Wt 165.0 lb

## 2023-06-25 DIAGNOSIS — I340 Nonrheumatic mitral (valve) insufficiency: Secondary | ICD-10-CM | POA: Diagnosis present

## 2023-06-25 DIAGNOSIS — E785 Hyperlipidemia, unspecified: Secondary | ICD-10-CM | POA: Diagnosis present

## 2023-06-25 DIAGNOSIS — Z8249 Family history of ischemic heart disease and other diseases of the circulatory system: Secondary | ICD-10-CM | POA: Diagnosis present

## 2023-06-25 DIAGNOSIS — I2510 Atherosclerotic heart disease of native coronary artery without angina pectoris: Secondary | ICD-10-CM | POA: Diagnosis present

## 2023-06-25 DIAGNOSIS — Z79899 Other long term (current) drug therapy: Secondary | ICD-10-CM

## 2023-06-25 MED ORDER — EZETIMIBE 10 MG PO TABS: 10.0000 mg | ORAL_TABLET | Freq: Every day | ORAL | Status: DC

## 2023-06-25 MED ORDER — ROSUVASTATIN CALCIUM 20 MG PO TABS: 20.0000 mg | ORAL_TABLET | Freq: Every day | ORAL | Status: DC

## 2023-06-25 NOTE — Patient Instructions (Addendum)
Medication Instructions:  Your physician recommends that you continue on your current medications as directed. Please refer to the Current Medication list given to you today.  *If you need a refill on your cardiac medications before your next appointment, please call your pharmacy*  Lab Work: None ordered.  If you have labs (blood work) drawn today and your tests are completely normal, you will receive your results only by: MyChart Message (if you have MyChart) OR A paper copy in the mail If you have any lab test that is abnormal or we need to change your treatment, we will call you to review the results.  Testing/Procedures: None ordered.  Follow-Up: At [facility], you and your health needs are our priority.  As part of our continuing mission to provide you with exceptional heart care, we have created designated Provider Care Teams.  These Care Teams include your primary Cardiologist (physician) and Advanced Practice Providers (APPs -  Physician Assistants and Nurse Practitioners) who all work together to provide you with the care you need, when you need it.   Your next appointment:   As needed  Follow up with with Dr Cliffton Asters  The format for your next appointment:   In Person  Provider:   Donato Schultz, MD

## 2023-06-25 NOTE — Progress Notes (Unsigned)
Cardiology Office Note:  .   Date:  06/25/2023  ID:  Katelyn Copeland, DOB 12-30-1955, MRN [id number] PCP: Laurann Montana, MD  [facility] Providers Cardiologist:  Donato Schultz, MD     History of Present Illness: .   Katelyn Copeland is a 68 y.o. female Discussed with the use of AI scribe  History of Present Illness   The patient is a 68 year old female with stable coronary artery disease who presents for follow-up.  She has a history of coronary artery disease and underwent a coronary CT scan on August 10, 2019, which revealed moderate left heart catheterization and proximal stenosis with a calcium score of 53, placing her in the 74th percentile. The fractional flow reserve was normal. An echocardiogram in 2021 showed normal pump function, mild regurgitation, and trivial aortic regurgitation.  She is currently taking aspirin 81 mg, Zetia 10 mg, and rosuvastatin 20 mg. Her LDL was 54 on August 22, 2022, which is at goal. Her creatinine level was 0.93, which is normal.  She experiences fatigue at times, which she attributes to her active lifestyle, including gardening, house maintenance, and car upkeep. She reports good sleep and plans to return to the gym after a break due to travel and personal commitments. She describes a sensation of cold skin in certain areas of her body, particularly when outside or sitting, but does not feel cold overall. A friend can verify this symptom.  Her family history is significant for heart disease, with her father having died at age 3 from congestive heart failure and her mother having had a pacemaker.          ROS: no CP, no SOB  Studies Reviewed: Marland Kitchen   EKG Interpretation Date/Time:  Wednesday June 25 2023 09:57:35 EST Ventricular Rate:  68 PR Interval:  152 QRS Duration:  74 QT Interval:  420 QTC Calculation: 446 R Axis:   -33  Text Interpretation: Normal sinus rhythm with sinus arrhythmia Left axis deviation Low voltage QRS Nonspecific  ST and T wave abnormality No previous ECGs available Confirmed by Donato Schultz ([id number]) on 06/25/2023 10:07:32 AM    Results   LABS LDL: 54 (08/22/2022) Creatinine: 0.93 (08/22/2022)  RADIOLOGY Coronary CT: Moderate LHC, AD proximal stenosis, Calcium score 53, 74 percentile, Normal FFR (08/10/2019)  DIAGNOSTIC Echocardiogram: Normal pump function, mild regurgitation, trivial aortic regurgitation (2021)     Risk Assessment/Calculations:            Physical Exam:   VS:  BP 112/78   Pulse 68   Ht 5' 7.5" (1.715 m)   Wt 165 lb (74.8 kg)   SpO2 98%   BMI 25.46 kg/m    Wt Readings from Last 3 Encounters:  06/25/23 165 lb (74.8 kg)  06/21/22 165 lb 12.8 oz (75.2 kg)  08/15/20 160 lb (72.6 kg)    GEN: Well nourished, well developed in no acute distress NECK: No JVD; No carotid bruits CARDIAC: RRR, no murmurs, no rubs, no gallops RESPIRATORY:  Clear to auscultation without rales, wheezing or rhonchi  ABDOMEN: Soft, non-tender, non-distended EXTREMITIES:  No edema; No deformity   ASSESSMENT AND PLAN: .    Assessment and Plan    Stable Coronary Artery Disease (CAD)   Moderate left heart catheterization (LHC) and AD proximal stenosis with a calcium score of 53 (74th percentile) and normal FFR. Echocardiogram in 2021 showed normal pump function, mild regurgitation, and trivial aortic regurgitation. Currently asymptomatic and well-managed on aspirin 81 mg, Zetia  10 mg, and rosuvastatin 20 mg. LDL was 54 on 08/22/2022, at goal. Creatinine is normal at 0.93. Active and feeling well overall. Discussed that if no initial adverse effects from Zetia, it is unlikely to affect liver or kidney function in the future. Graduating from cardiology clinic with continued monitoring by primary care physician.   - Graduate from cardiology clinic   - Ensure refills and lipid checks are managed by primary care physician (Dr. Cliffton Asters)   - Remain available for consultation if needed    General Health  Maintenance   Active lifestyle with regular exercise at [facility], which she plans to resume. Aware of the importance of maintaining physical activity for overall health.   - Encourage resumption of regular exercise at [employer]   - Continue monitoring general health and wellness with primary care physician    Follow-up   - Follow up with primary care physician for routine checks and medication management   - Contact cardiology clinic if any new symptoms or concerns arise.               Signed, Donato Schultz, MD

## 2023-06-26 ENCOUNTER — Other Ambulatory Visit: Payer: Self-pay | Admitting: Cardiology

## 2023-06-26 NOTE — Telephone Encounter (Signed)
Pt was seen by Dr. Anne Fu yesterday. These medication were requested, but not sent to pt's pharmacy. Do Dr. Anne Fu like to refill these medications for pt? Please address

## 2023-07-17 NOTE — Progress Notes (Signed)
 SUBJECTIVE   History of Present Illness The patient is a 68 year old female who was last seen in 2019 for pelvic organ prolapse that was managed with a ring with support pessary.  She reports that the pessary has been effective, with no noticeable bulging around it. She has not experienced any recent health changes, except for a urinary tract infection (UTI) characterized by an unusual odor and associated back pain. She has had UTIs before, but this was a bladder infection. She has never had a UTI that had odor and made her back hurt. She does not use vaginal estrogen cream. She reports no new medical issues since her last visit. She maintains an active lifestyle, including regular gym workouts and gardening. She removes the pessary approximately once a week and reports occasional sexual activity. She does not believe the UTI was related to sexual intercourse and notes that she typically urinates post-coitus. She has noticed small bumps around the pessary, similar to pimples. She has never taken estrogen. She recalls an incident where the pessary dislodged during a bowel movement and fell into the toilet.  She reports no difficulty in bowel movements but mentions occasional loose stools and subsequent incontinence. She attributes this to incomplete bowel evacuation and physical exertion, such as yard work. She has made dietary adjustments to manage this issue.  SOCIAL HISTORY She is retired.  FAMILY HISTORY She has two aunts who had surgery to tack their bladder up and were obese.  Past Medical History: Patient  has a past medical history of Allergy.    Past Surgical History: She  has a past surgical history that includes Hysterectomy; Breast biopsy; and Total hip arthroplasty (Left).   Past OB/GYN History: OB History     Gravida  4   Para  2   Term  2   Preterm      AB  2   Living  2      SAB  2   IAB      Ectopic      Molar      Multiple      Live Births  2          She is menopausal.  She is  sexually active.  She denies dyspareunia. Last pap smear was NA.    Medications: She has a current medication list which includes the following prescription(s): aspirin , biotin , cetirizine, cholecalciferol , coenzyme q10-vitamin e, cyanocobalamin, ezetimibe , fluticasone propionate, lactase, rosuvastatin , turmeric, zinc gluconate, fluconazole , and lorazepam .   Allergies: Patient is allergic to erythromycin base, oxycodone , and penicillins.   Social History: Patient  reports that she has never smoked. She has never used smokeless tobacco. She reports that she does not currently use alcohol. She reports that she does not use drugs.   Family History: family history includes Arthritis in her mother; Cancer in her mother; Congenital heart disease in her father; Diabetes in her mother; Emphysema in her father; Heart disease in her father and mother.   Review of Systems:  Constitutional:  Patient denies any unintentional weight loss or change in strength Integumentary:  Patient denies any rashes or pruritus Eyes:  Patient denies double vision or eye pain Ears/Nose/Mouth/Throat:  Patient denies any nosebleeds or gum bleeding Cardiovascular:  Patient denies chest pain or syncope Respiratory: Patient denies hemoptysis  Gastrointestinal:  Patient denies nausea, vomiting, constipation, or diarrhea Musculoskeletal:  Patient denies muscle cramps or weakness Neurologic: Patient denies convulsions or seizures Psychiatric: Patient denies hallucinations or suicidal ideations Alleric/Immunologic: Patient denies food  allergies Hematologic/Lymphatic: Patient denies bleeding tendencies Endocrine:  Patient denies heat/cold intolerance     Physical Exam:  BP 122/71 (BP Location: Left arm, Patient Position: Sitting)   Pulse 68   Temp 96.5 F (35.8 C) (Temporal)   SpO2 100%   Physical Exam The pessary is in the correct position within the genitourinary system. There is  no significant bulge in front of it. The skin in the genitourinary area appears healthy. The bladder is empty. A small bulge is noted from the back wall of the vagina, beyond the pessary.  Results    Assessment & Plan 1. Pelvic organ prolapse. The pessary is appropriately positioned, providing effective bladder support at the apex of the vagina. However, a minor rectocele is present, which is beyond the pessary's range of support. This could potentially lead to incomplete evacuation of the rectocele if stool consistency is abnormal, resulting in stool leakage due to increased intra-abdominal pressure. The absence of lactobacillus, a beneficial bacterium in the vagina, may have contributed to the development of a urinary tract infection (UTI). Surgical intervention is not recommended at this time, nor is a change in pessary size. She is managing her pessary maintenance effectively. A prescription for Estrace  cream will be sent to her pharmacy. She is advised to apply a blueberry-sized amount of estrogen cream to the pessary twice weekly, once during the pessary change and once midweek. This regimen should help prevent future UTIs.  2. Rectocele. A minor rectocele is present, which is beyond the pessary's range of support. This could potentially lead to incomplete evacuation of the rectocele if stool consistency is abnormal, resulting in stool leakage due to increased intra-abdominal pressure. She is advised to maintain a diet that promotes normal stool consistency to manage this condition.   Electronically signed by: Dorothyann Jenkins Ku, MD 07/17/2023 1:16 PM
